# Patient Record
Sex: Female | Born: 1960
Health system: Southern US, Community
[De-identification: ages and names within clinical notes are randomized; demographics above are authoritative.]

## PROBLEM LIST (undated history)

## (undated) DIAGNOSIS — T7840XA Allergy, unspecified, initial encounter: Secondary | ICD-10-CM

## (undated) DIAGNOSIS — G589 Mononeuropathy, unspecified: Secondary | ICD-10-CM

## (undated) DIAGNOSIS — D573 Sickle-cell trait: Secondary | ICD-10-CM

## (undated) DIAGNOSIS — E7800 Pure hypercholesterolemia, unspecified: Secondary | ICD-10-CM

## (undated) DIAGNOSIS — D649 Anemia, unspecified: Secondary | ICD-10-CM

## (undated) HISTORY — PX: COLOSTOMY: SHX63

## (undated) HISTORY — PX: ABDOMINAL HYSTERECTOMY: SHX81

## (undated) HISTORY — PX: COLONOSCOPY: SHX174

## (undated) HISTORY — DX: Anemia, unspecified: D64.9

## (undated) HISTORY — PX: COLOSTOMY CLOSURE: SHX1381

## (undated) HISTORY — DX: Allergy, unspecified, initial encounter: T78.40XA

## (undated) HISTORY — PX: COLON SURGERY: SHX602

---

## 1999-01-18 ENCOUNTER — Encounter: Payer: Self-pay | Admitting: Internal Medicine

## 1999-01-18 ENCOUNTER — Encounter: Admission: RE | Admit: 1999-01-18 | Discharge: 1999-01-18 | Payer: Self-pay | Admitting: Internal Medicine

## 1999-01-20 ENCOUNTER — Inpatient Hospital Stay (HOSPITAL_COMMUNITY): Admission: RE | Admit: 1999-01-20 | Discharge: 1999-01-23 | Payer: Self-pay | Admitting: *Deleted

## 1999-03-10 ENCOUNTER — Inpatient Hospital Stay (HOSPITAL_COMMUNITY): Admission: RE | Admit: 1999-03-10 | Discharge: 1999-03-16 | Payer: Self-pay | Admitting: *Deleted

## 1999-03-10 ENCOUNTER — Emergency Department (HOSPITAL_COMMUNITY): Admission: EM | Admit: 1999-03-10 | Discharge: 1999-03-10 | Payer: Self-pay | Admitting: *Deleted

## 1999-11-09 ENCOUNTER — Ambulatory Visit (HOSPITAL_BASED_OUTPATIENT_CLINIC_OR_DEPARTMENT_OTHER): Admission: RE | Admit: 1999-11-09 | Discharge: 1999-11-09 | Payer: Self-pay | Admitting: *Deleted

## 2000-01-19 ENCOUNTER — Encounter: Payer: Self-pay | Admitting: Internal Medicine

## 2000-01-19 ENCOUNTER — Encounter: Admission: RE | Admit: 2000-01-19 | Discharge: 2000-01-19 | Payer: Self-pay | Admitting: Internal Medicine

## 2002-12-07 ENCOUNTER — Encounter: Payer: Self-pay | Admitting: Emergency Medicine

## 2002-12-07 ENCOUNTER — Emergency Department (HOSPITAL_COMMUNITY): Admission: EM | Admit: 2002-12-07 | Discharge: 2002-12-07 | Payer: Self-pay | Admitting: Emergency Medicine

## 2010-04-02 ENCOUNTER — Other Ambulatory Visit: Payer: Self-pay | Admitting: Emergency Medicine

## 2010-04-02 DIAGNOSIS — Z1231 Encounter for screening mammogram for malignant neoplasm of breast: Secondary | ICD-10-CM

## 2010-04-12 ENCOUNTER — Ambulatory Visit
Admission: RE | Admit: 2010-04-12 | Discharge: 2010-04-12 | Disposition: A | Discharge status: Home / Self Care | Payer: Federal, State, Local not specified - PPO | Source: Ambulatory Visit | Attending: Emergency Medicine | Admitting: Emergency Medicine

## 2010-04-12 DIAGNOSIS — Z1231 Encounter for screening mammogram for malignant neoplasm of breast: Secondary | ICD-10-CM

## 2010-11-04 ENCOUNTER — Other Ambulatory Visit: Payer: Self-pay | Admitting: Family Medicine

## 2010-11-18 ENCOUNTER — Ambulatory Visit
Admission: RE | Admit: 2010-11-18 | Discharge: 2010-11-18 | Disposition: A | Discharge status: Home / Self Care | Payer: Federal, State, Local not specified - PPO | Source: Ambulatory Visit | Attending: Family Medicine | Admitting: Family Medicine

## 2011-02-28 ENCOUNTER — Ambulatory Visit (INDEPENDENT_AMBULATORY_CARE_PROVIDER_SITE_OTHER): Payer: Federal, State, Local not specified - PPO

## 2011-02-28 DIAGNOSIS — E119 Type 2 diabetes mellitus without complications: Secondary | ICD-10-CM

## 2011-02-28 DIAGNOSIS — E785 Hyperlipidemia, unspecified: Secondary | ICD-10-CM

## 2011-03-28 ENCOUNTER — Other Ambulatory Visit: Payer: Self-pay | Admitting: Family Medicine

## 2011-04-28 ENCOUNTER — Emergency Department (HOSPITAL_COMMUNITY)
Admission: EM | Admit: 2011-04-28 | Discharge: 2011-04-28 | Disposition: A | Discharge status: Home Health | Payer: Federal, State, Local not specified - PPO | Attending: Emergency Medicine | Admitting: Emergency Medicine

## 2011-04-28 ENCOUNTER — Encounter (HOSPITAL_COMMUNITY): Payer: Self-pay | Admitting: Emergency Medicine

## 2011-04-28 DIAGNOSIS — R197 Diarrhea, unspecified: Secondary | ICD-10-CM | POA: Insufficient documentation

## 2011-04-28 DIAGNOSIS — E119 Type 2 diabetes mellitus without complications: Secondary | ICD-10-CM | POA: Insufficient documentation

## 2011-04-28 DIAGNOSIS — R6883 Chills (without fever): Secondary | ICD-10-CM | POA: Insufficient documentation

## 2011-04-28 DIAGNOSIS — R5381 Other malaise: Secondary | ICD-10-CM | POA: Insufficient documentation

## 2011-04-28 DIAGNOSIS — R5383 Other fatigue: Secondary | ICD-10-CM | POA: Insufficient documentation

## 2011-04-28 DIAGNOSIS — R112 Nausea with vomiting, unspecified: Secondary | ICD-10-CM

## 2011-04-28 DIAGNOSIS — R109 Unspecified abdominal pain: Secondary | ICD-10-CM | POA: Insufficient documentation

## 2011-04-28 DIAGNOSIS — E7800 Pure hypercholesterolemia, unspecified: Secondary | ICD-10-CM | POA: Insufficient documentation

## 2011-04-28 DIAGNOSIS — R000 Tachycardia, unspecified: Secondary | ICD-10-CM | POA: Insufficient documentation

## 2011-04-28 DIAGNOSIS — Z79899 Other long term (current) drug therapy: Secondary | ICD-10-CM | POA: Insufficient documentation

## 2011-04-28 HISTORY — DX: Pure hypercholesterolemia, unspecified: E78.00

## 2011-04-28 HISTORY — DX: Sickle-cell trait: D57.3

## 2011-04-28 LAB — BASIC METABOLIC PANEL
BUN: 18 mg/dL (ref 6–23)
CO2: 21 mEq/L (ref 19–32)
Calcium: 10.6 mg/dL — ABNORMAL HIGH (ref 8.4–10.5)
Chloride: 104 mEq/L (ref 96–112)
Creatinine, Ser: 0.73 mg/dL (ref 0.50–1.10)
GFR calc Af Amer: >90 mL/min (ref >90)
GFR calc non Af Amer: >90 mL/min (ref >90)
Glucose, Bld: 216 mg/dL — ABNORMAL HIGH (ref 70–99)
Potassium: 4.1 mEq/L (ref 3.5–5.1)
Sodium: 139 mEq/L (ref 135–145)

## 2011-04-28 LAB — DIFFERENTIAL
Basophils Absolute: 0 10*3/uL (ref 0.0–0.1)
Basophils Relative: 0 % (ref 0–1)
Eosinophils Absolute: 0 10*3/uL (ref 0.0–0.7)
Eosinophils Relative: 0 % (ref 0–5)
Lymphocytes Relative: 2 % — ABNORMAL LOW (ref 12–46)
Lymphs Abs: 0.4 10*3/uL — ABNORMAL LOW (ref 0.7–4.0)
Monocytes Absolute: 0.8 10*3/uL (ref 0.1–1.0)
Monocytes Relative: 4 % (ref 3–12)
Neutro Abs: 16.6 10*3/uL — ABNORMAL HIGH (ref 1.7–7.7)
Neutrophils Relative %: 93 % — ABNORMAL HIGH (ref 43–77)

## 2011-04-28 LAB — URINALYSIS, ROUTINE W REFLEX MICROSCOPIC
Bilirubin Urine: NEGATIVE
Glucose, UA: 100 mg/dL — AB
Hgb urine dipstick: NEGATIVE
Ketones, ur: 40 mg/dL — AB
Leukocytes, UA: NEGATIVE
Nitrite: NEGATIVE
Protein, ur: NEGATIVE mg/dL
Specific Gravity, Urine: 1.024 (ref 1.005–1.030)
Urobilinogen, UA: 0.2 mg/dL (ref 0.0–1.0)
pH: 6 (ref 5.0–8.0)

## 2011-04-28 LAB — CBC
HCT: 42.6 % (ref 36.0–46.0)
Hemoglobin: 14.6 g/dL (ref 12.0–15.0)
MCH: 27.2 pg (ref 26.0–34.0)
MCHC: 34.3 g/dL (ref 30.0–36.0)
MCV: 79.3 fL (ref 78.0–100.0)
Platelets: 208 10*3/uL (ref 150–400)
RBC: 5.37 MIL/uL — ABNORMAL HIGH (ref 3.87–5.11)
RDW: 14.3 % (ref 11.5–15.5)
WBC: 17.8 10*3/uL — ABNORMAL HIGH (ref 4.0–10.5)

## 2011-04-28 MED ORDER — MORPHINE SULFATE 4 MG/ML IJ SOLN
4.0000 mg | Freq: Once | INTRAMUSCULAR | Status: AC
  Administered 2011-04-28: 4 mg via INTRAVENOUS
  Filled 2011-04-28: qty 1

## 2011-04-28 MED ORDER — SODIUM CHLORIDE 0.9 % IV BOLUS (SEPSIS)
1000.0000 mL | Freq: Once | INTRAVENOUS | Status: AC
  Administered 2011-04-28: 1000 mL via INTRAVENOUS

## 2011-04-28 MED ORDER — ONDANSETRON HCL 4 MG/2ML IJ SOLN
4.0000 mg | Freq: Once | INTRAMUSCULAR | Status: AC
  Administered 2011-04-28: 4 mg via INTRAVENOUS
  Filled 2011-04-28: qty 2

## 2011-04-28 MED ORDER — ONDANSETRON 8 MG PO TBDP: 8.0000 mg | ORAL_TABLET | Freq: Three times a day (TID) | ORAL | Status: AC | PRN

## 2011-04-28 NOTE — ED Notes (Signed)
No nausea or vomiting while at the hospital. Water, Ginger ale, and crackers given. Will continue to monitor.

## 2011-04-28 NOTE — Discharge Instructions (Signed)
Diet for Diarrhea, Adult Having frequent, runny stools (diarrhea) has many causes. Diarrhea may be caused or worsened by food or drink. Diarrhea may be relieved by changing your diet. IF YOU ARE NOT TOLERATING SOLID FOODS:  Drink enough water and fluids to keep your urine clear or pale yellow.   Avoid sugary drinks and sodas as well as milk-based beverages.   Avoid beverages containing caffeine and alcohol.   You may try rehydrating beverages. You can make your own by following this recipe:    tsp table salt.    tsp baking soda.   ? tsp salt substitute (potassium chloride).   1 tbs + 1 tsp sugar.   1 qt water.  As your stools become more solid, you can start eating solid foods. Add foods one at a time. If a certain food causes your diarrhea to get worse, avoid that food and try other foods. A low fiber, low-fat, and lactose-free diet is recommended. Small, frequent meals may be better tolerated.  Starches  Allowed:  White, French, and pita breads, plain rolls, buns, bagels. Plain muffins, matzo. Soda, saltine, or graham crackers. Pretzels, melba toast, zwieback. Cooked cereals made with water: cornmeal, farina, cream cereals. Dry cereals: refined corn, wheat, rice. Potatoes prepared any way without skins, refined macaroni, spaghetti, noodles, refined rice.   Avoid:  Bread, rolls, or crackers made with whole wheat, multi-grains, rye, bran seeds, nuts, or coconut. Corn tortillas or taco shells. Cereals containing whole grains, multi-grains, bran, coconut, nuts, or raisins. Cooked or dry oatmeal. Coarse wheat cereals, granola. Cereals advertised as "high-fiber." Potato skins. Whole grain pasta, wild or brown rice. Popcorn. Sweet potatoes/yams. Sweet rolls, doughnuts, waffles, pancakes, sweet breads.  Vegetables  Allowed: Strained tomato and vegetable juices. Most well-cooked and canned vegetables without seeds. Fresh: Tender lettuce, cucumber without the skin, cabbage, spinach, bean  sprouts.   Avoid: Fresh, cooked, or canned: Artichokes, baked beans, beet greens, broccoli, Brussels sprouts, corn, kale, legumes, peas, sweet potatoes. Cooked: Green or red cabbage, spinach. Avoid large servings of any vegetables, because vegetables shrink when cooked, and they contain more fiber per serving than fresh vegetables.  Fruit  Allowed: All fruit juices except prune juice. Cooked or canned: Apricots, applesauce, cantaloupe, cherries, fruit cocktail, grapefruit, grapes, kiwi, mandarin oranges, peaches, pears, plums, watermelon. Fresh: Apples without skin, ripe banana, grapes, cantaloupe, cherries, grapefruit, peaches, oranges, plums. Keep servings limited to  cup or 1 piece.   Avoid: Fresh: Apple with skin, apricots, mango, pears, raspberries, strawberries. Prune juice, stewed or dried prunes. Dried fruits, raisins, dates. Large servings of all fresh fruits.  Meat and Meat Substitutes  Allowed: Ground or well-cooked tender beef, ham, veal, lamb, pork, or poultry. Eggs, plain cheese. Fish, oysters, shrimp, lobster, other seafoods. Liver, organ meats.   Avoid: Tough, fibrous meats with gristle. Peanut butter, smooth or chunky. Cheese, nuts, seeds, legumes, dried peas, beans, lentils.  Milk  Allowed: Yogurt, lactose-free milk, kefir, drinkable yogurt, buttermilk, soy milk.   Avoid: Milk, chocolate milk, beverages made with milk, such as milk shakes.  Soups  Allowed: Bouillon, broth, or soups made from allowed foods. Any strained soup.   Avoid: Soups made from vegetables that are not allowed, cream or milk-based soups.  Desserts and Sweets  Allowed: Sugar-free gelatin, sugar-free frozen ice pops made without sugar alcohol.   Avoid: Plain cakes and cookies, pie made with allowed fruit, pudding, custard, cream pie. Gelatin, fruit, ice, sherbet, frozen ice pops. Ice cream, ice milk without nuts. Plain hard candy,   honey, jelly, molasses, syrup, sugar, chocolate syrup, gumdrops,  marshmallows.  Fats and Oils  Allowed: Avoid any fats and oils.   Avoid: Seeds, nuts, olives, avocados. Margarine, butter, cream, mayonnaise, salad oils, plain salad dressings made from allowed foods. Plain gravy, crisp bacon without rind.  Beverages  Allowed: Water, decaffeinated teas, oral rehydration solutions, sugar-free beverages.   Avoid: Fruit juices, caffeinated beverages (coffee, tea, soda or pop), alcohol, sports drinks, or lemon-lime soda or pop.  Condiments  Allowed: Ketchup, mustard, horseradish, vinegar, cream sauce, cheese sauce, cocoa powder. Spices in moderation: allspice, basil, bay leaves, celery powder or leaves, cinnamon, cumin powder, curry powder, ginger, mace, marjoram, onion or garlic powder, oregano, paprika, parsley flakes, ground pepper, rosemary, sage, savory, tarragon, thyme, turmeric.   Avoid: Coconut, honey.  Weight Monitoring: Weigh yourself every day. You should weigh yourself in the morning after you urinate and before you eat breakfast. Wear the same amount of clothing when you weigh yourself. Record your weight daily. Bring your recorded weights to your clinic visits. Tell your caregiver right away if you have gained 3 lb/1.4 kg or more in 1 day, 5 lb/2.3 kg in a week, or whatever amount you were told to report. SEEK IMMEDIATE MEDICAL CARE IF:   You are unable to keep fluids down.   You start to throw up (vomit) or diarrhea keeps coming back (persistent).   Abdominal pain develops, increases, or can be felt in one place (localizes).   You have an oral temperature above 102 F (38.9 C), not controlled by medicine.   Diarrhea contains blood or mucus.   You develop excessive weakness, dizziness, fainting, or extreme thirst.  MAKE SURE YOU:   Understand these instructions.   Will watch your condition.   Will get help right away if you are not doing well or get worse.  Document Released: 04/30/2003 Document Revised: 01/27/2011 Document Reviewed:  08/21/2008 Exit[employer] Patient Information 2012 [employer].Nausea and Vomiting Nausea is a sick feeling that often comes before throwing up (vomiting). Vomiting is a reflex where stomach contents come out of your mouth. Vomiting can cause severe loss of body fluids (dehydration). Children and elderly adults can become dehydrated quickly, especially if they also have diarrhea. Nausea and vomiting are symptoms of a condition or disease. It is important to find the cause of your symptoms. CAUSES   Direct irritation of the stomach lining. This irritation can result from increased acid production (gastroesophageal reflux disease), infection, food poisoning, taking certain medicines (such as nonsteroidal anti-inflammatory drugs), alcohol use, or tobacco use.   Signals from the brain.These signals could be caused by a headache, heat exposure, an inner ear disturbance, increased pressure in the brain from injury, infection, a tumor, or a concussion, pain, emotional stimulus, or metabolic problems.   An obstruction in the gastrointestinal tract (bowel obstruction).   Illnesses such as diabetes, hepatitis, gallbladder problems, appendicitis, kidney problems, cancer, sepsis, atypical symptoms of a heart attack, or eating disorders.   Medical treatments such as chemotherapy and radiation.   Receiving medicine that makes you sleep (general anesthetic) during surgery.  DIAGNOSIS Your caregiver may ask for tests to be done if the problems do not improve after a few days. Tests may also be done if symptoms are severe or if the reason for the nausea and vomiting is not clear. Tests may include:  Urine tests.   Blood tests.   Stool tests.   Cultures (to look for evidence of infection).   X-rays or other imaging   studies.  Test results can help your caregiver make decisions about treatment or the need for additional tests. TREATMENT You need to stay well hydrated. Drink frequently but in small  amounts.You may wish to drink water, sports drinks, clear broth, or eat frozen ice pops or gelatin dessert to help stay hydrated.When you eat, eating slowly may help prevent nausea.There are also some antinausea medicines that may help prevent nausea. HOME CARE INSTRUCTIONS   Take all medicine as directed by your caregiver.   If you do not have an appetite, do not force yourself to eat. However, you must continue to drink fluids.   If you have an appetite, eat a normal diet unless your caregiver tells you differently.   Eat a variety of complex carbohydrates (rice, wheat, potatoes, bread), lean meats, yogurt, fruits, and vegetables.   Avoid high-fat foods because they are more difficult to digest.   Drink enough water and fluids to keep your urine clear or pale yellow.   If you are dehydrated, ask your caregiver for specific rehydration instructions. Signs of dehydration may include:   Severe thirst.   Dry lips and mouth.   Dizziness.   Dark urine.   Decreasing urine frequency and amount.   Confusion.   Rapid breathing or pulse.  SEEK IMMEDIATE MEDICAL CARE IF:   You have blood or brown flecks (like coffee grounds) in your vomit.   You have black or bloody stools.   You have a severe headache or stiff neck.   You are confused.   You have severe abdominal pain.   You have chest pain or trouble breathing.   You do not urinate at least once every 8 hours.   You develop cold or clammy skin.   You continue to vomit for longer than 24 to 48 hours.   You have a fever.  MAKE SURE YOU:   Understand these instructions.   Will watch your condition.   Will get help right away if you are not doing well or get worse.  Document Released: 02/07/2005 Document Revised: 01/27/2011 Document Reviewed: 07/07/2010 [employer] Patient Information 2012 [employer]. 

## 2011-04-28 NOTE — ED Notes (Signed)
PT. REPORTS PERSISTENT NAUSEA,VOMITTING , DIARRHEA WITH CHILLS ONSET THIS EVENING ,  STATES MOTHER IS ALSO SICK AT HOME .

## 2011-04-28 NOTE — ED Notes (Signed)
Pt stated that she started having intermittent abdominal cramping since 04/26/11. However, 04/27/11 she begin to have n/v and diarrhea. No blood in either. No cardiac or respiratory distress. Will continue to monitor.

## 2011-04-28 NOTE — ED Notes (Signed)
Ice water given to PT. Will continue to monitor.

## 2011-04-28 NOTE — ED Provider Notes (Signed)
History     CSN: [id number]  Arrival date & time 04/28/11  0059   First MD Initiated Contact with Patient 04/28/11 0127      Chief Complaint  Patient presents with  . Emesis    (Consider location/radiation/quality/duration/timing/severity/associated sxs/prior treatment) Patient is a 51 y.o. female presenting with vomiting. The history is provided by the patient.  Emesis  This is a new problem. Associated symptoms include abdominal pain and diarrhea. Pertinent negatives include no headaches.   patient has had nausea vomiting and diarrhea that began last night. She's also had some upper abdominal cramping. She's had some chills without clear fever. Her mother has had similar symptoms and was seen in ER yesterday for the same. No lightheadedness or dizziness. No dysuria. She states everything she eats,. She states she's also had a lot of dry heaving.  Past Medical History  Diagnosis Date  . Diabetes mellitus   . Hypercholesteremia   . Sickle cell trait   . Endometriosis     Past Surgical History  Procedure Date  . Abdominal hysterectomy   . Colostomy   . Colostomy closure     No family history on file.  History  Substance Use Topics  . Smoking status: Never Smoker   . Smokeless tobacco: Not on file  . Alcohol Use: No    OB History    Grav Para Term Preterm Abortions TAB SAB Ect Mult Living                  Review of Systems  Constitutional: Positive for fatigue. Negative for activity change and appetite change.  HENT: Negative for neck stiffness.   Eyes: Negative for pain.  Respiratory: Negative for chest tightness and shortness of breath.   Cardiovascular: Negative for chest pain and leg swelling.  Gastrointestinal: Positive for nausea, abdominal pain and diarrhea.  Genitourinary: Negative for flank pain.  Musculoskeletal: Negative for back pain.  Skin: Negative for rash.  Neurological: Negative for weakness, numbness and headaches.  Psychiatric/Behavioral:  Negative for behavioral problems.    Allergies  Cephalexin; Amoxicillin; Levsin; and Sulfa antibiotics  Home Medications   Current Outpatient Rx  Name Route Sig Dispense Refill  . CALCIUM GLUCONATE 500 MG PO TABS Oral Take 500 mg by mouth daily.    . CHOLECALCIFEROL 400 UNITS PO TABS Oral Take 400 Units by mouth daily.    Marland Kitchen GARLIC 10 MG PO CAPS Oral Take 1 tablet by mouth daily.    Marland Kitchen LORATADINE-PSEUDOEPHEDRINE ER 5-120 MG PO TB12 Oral Take 1 tablet by mouth 2 (two) times daily as needed. For congestion and headache    . METFORMIN HCL 500 MG PO TABS Oral Take 500 mg by mouth daily with breakfast.    . PRAVASTATIN SODIUM 40 MG PO TABS Oral Take 40 mg by mouth every evening.    Marland Kitchen ONDANSETRON 8 MG PO TBDP Oral Take 1 tablet (8 mg total) by mouth every 8 (eight) hours as needed for nausea. 20 tablet 0    BP 119/70  Pulse 87  Temp(Src) 98.1 F (36.7 C) (Oral)  Resp 18  SpO2 99%  Physical Exam  Nursing note and vitals reviewed. Constitutional: She is oriented to person, place, and time. She appears well-developed and well-nourished.  HENT:  Head: Normocephalic and atraumatic.  Eyes: EOM are normal. Pupils are equal, round, and reactive to light.  Cardiovascular: Regular rhythm and normal heart sounds.   No murmur heard.      Mild tachycardia  Pulmonary/Chest:  Effort normal and breath sounds normal. No respiratory distress. She has no wheezes. She has no rales.  Abdominal: Soft. Bowel sounds are normal. She exhibits no distension. There is no tenderness. There is no rebound and no guarding.  Musculoskeletal: Normal range of motion.  Neurological: She is alert and oriented to person, place, and time. No cranial nerve deficit.  Skin: Skin is warm and dry.  Psychiatric: She has a normal mood and affect. Her speech is normal.    ED Course  Procedures (including critical care time)  Labs Reviewed  URINALYSIS, ROUTINE W REFLEX MICROSCOPIC - Abnormal; Notable for the following:     Glucose, UA 100 (*)    Ketones, ur 40 (*)    All other components within normal limits  CBC - Abnormal; Notable for the following:    WBC 17.8 (*)    RBC 5.37 (*)    All other components within normal limits  DIFFERENTIAL - Abnormal; Notable for the following:    Neutrophils Relative 93 (*)    Neutro Abs 16.6 (*)    Lymphocytes Relative 2 (*)    Lymphs Abs 0.4 (*)    All other components within normal limits  BASIC METABOLIC PANEL - Abnormal; Notable for the following:    Glucose, Bld 216 (*)    Calcium 10.6 (*)    All other components within normal limits   No results found.   1. Nausea vomiting and diarrhea       MDM  NVD with sick contacts. Patient feels better after treatment and vitals have improved. Labs are reassuring overall. WBC elevation likely due to viral infection. Patient has tolerated orals and will be discharged.        Juliet Rude. Rubin Payor, MD 04/28/11 [phone]

## 2011-07-20 ENCOUNTER — Other Ambulatory Visit: Payer: Self-pay | Admitting: Family Medicine

## 2011-09-07 ENCOUNTER — Ambulatory Visit: Payer: Federal, State, Local not specified - PPO

## 2011-09-07 ENCOUNTER — Ambulatory Visit (INDEPENDENT_AMBULATORY_CARE_PROVIDER_SITE_OTHER): Payer: Federal, State, Local not specified - PPO | Admitting: Family Medicine

## 2011-09-07 VITALS — BP 120/88 | HR 86 | Temp 98.7°F | Resp 16 | Ht 61.5 in | Wt 154.4 lb

## 2011-09-07 DIAGNOSIS — M25569 Pain in unspecified knee: Secondary | ICD-10-CM

## 2011-09-07 MED ORDER — OXAPROZIN 600 MG PO TABS: ORAL_TABLET | ORAL | Status: DC

## 2011-09-07 MED ORDER — HYDROCODONE-ACETAMINOPHEN 5-500 MG PO TABS: ORAL_TABLET | ORAL | Status: DC

## 2011-09-07 NOTE — Patient Instructions (Signed)
Wear knee immobilizer  Take Daypro twice daily  Use hydrocodone only for severe pain  Ice 4-5 times daily  Return Sunday

## 2011-09-07 NOTE — Progress Notes (Signed)
Subjective: 51 year old lady with history of wearing a pair of clogs yesterday and stepping wrong, twisting her left ankle. When she caught herself with her right leg she strained her right knee and head pain in it immediately. She is able to walk on it, but if she turns it just a wrong angle or something it will give her intense pain. She has trouble relaxing at because of these pains. Is not swollen or bruised it he. She alternated use of ice and heat on it. She took some over-the-counter Motrin yesterday and today. She has not had prior knee problems. Her left ankle did not bother her as a consequence of the accident.  Objective: No point tenderness of the right knee. She preferentially holds it straight. There is no tenderness of the thigh or calf. Minimal tenderness in the popliteal fossa. No effusion could be palpated. No obvious weakness of the ligaments. When she lay down on her stomach and try that and I attempted to flex her knee, she had intense pain from it.  Assessment: Right knee pain, suspicious for possible internal derangement.  Plan: X-ray right knee  [facility] reading (PRIMARY) by  Dr. Alwyn Ren Possible effusion.  No bony or joint injury .  This seems to be an unusual injury of some sort. We will rested a few days with a knee immobilizer. She is to stay off work, taking NSAIDs, and ice it. She is to return on Sunday for recheck. If she is not doing a lot better I think she should be referred to orthopedist. I tend to let the orthopedic doctor decide on further studies

## 2011-09-11 ENCOUNTER — Ambulatory Visit (INDEPENDENT_AMBULATORY_CARE_PROVIDER_SITE_OTHER): Payer: Federal, State, Local not specified - PPO | Admitting: Family Medicine

## 2011-09-11 VITALS — BP 118/82 | HR 75 | Temp 97.8°F | Resp 16 | Ht 62.5 in | Wt 154.2 lb

## 2011-09-11 DIAGNOSIS — S8392XA Sprain of unspecified site of left knee, initial encounter: Secondary | ICD-10-CM

## 2011-09-11 DIAGNOSIS — E785 Hyperlipidemia, unspecified: Secondary | ICD-10-CM

## 2011-09-11 DIAGNOSIS — E109 Type 1 diabetes mellitus without complications: Secondary | ICD-10-CM

## 2011-09-11 DIAGNOSIS — E119 Type 2 diabetes mellitus without complications: Secondary | ICD-10-CM

## 2011-09-11 LAB — COMPREHENSIVE METABOLIC PANEL
ALT: 14 U/L (ref 0–35)
AST: 17 U/L (ref 0–37)
Albumin: 4.2 g/dL (ref 3.5–5.2)
Alkaline Phosphatase: 64 U/L (ref 39–117)
BUN: 16 mg/dL (ref 6–23)
CO2: 23 mEq/L (ref 19–32)
Calcium: 9.3 mg/dL (ref 8.4–10.5)
Chloride: 105 mEq/L (ref 96–112)
Creat: 0.87 mg/dL (ref 0.50–1.10)
Glucose, Bld: 122 mg/dL — ABNORMAL HIGH (ref 70–99)
Potassium: 4.2 mEq/L (ref 3.5–5.3)
Sodium: 139 mEq/L (ref 135–145)
Total Bilirubin: 0.3 mg/dL (ref 0.3–1.2)
Total Protein: 6.9 g/dL (ref 6.0–8.3)

## 2011-09-11 LAB — LIPID PANEL
Cholesterol: 179 mg/dL (ref 0–200)
HDL: 45 mg/dL (ref >39)
LDL Cholesterol: 113 mg/dL — ABNORMAL HIGH (ref 0–99)
Total CHOL/HDL Ratio: 4 Ratio
Triglycerides: 103 mg/dL (ref <150)
VLDL: 21 mg/dL (ref 0–40)

## 2011-09-11 LAB — GLUCOSE, POCT (MANUAL RESULT ENTRY): POC Glucose: 122 mg/dl — AB (ref 70–99)

## 2011-09-11 LAB — MICROALBUMIN, URINE: Microalb, Ur: 1.47 mg/dL (ref 0.00–1.89)

## 2011-09-11 LAB — POCT GLYCOSYLATED HEMOGLOBIN (HGB A1C): Hemoglobin A1C: 6.7

## 2011-09-11 MED ORDER — METFORMIN HCL 500 MG PO TABS: 500.0000 mg | ORAL_TABLET | Freq: Every day | ORAL | Status: DC

## 2011-09-11 NOTE — Progress Notes (Signed)
Subjective:  51 year old lady with history of wearing a pair of clogs Tuesday and stepping wrong, twisting her left ankle. When she caught herself with her right leg she strained her right knee and had pain in it immediately.  She is able to walk on it, but if she turns it just a wrong angle or something it will give her intense pain. She has trouble relaxing at because of these pains. Is not swollen or bruised. She alternated use of ice and heat on it. She took some over-the-counter Motrin initially. She has not had prior knee problems. Her left ankle did not bother her as a consequence of the accident.  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  The knee is much better.  Now, pain is 3/10  O:  Right knee:  No effusion, nontender, ligaments intact ROM: good  A:  Good improvement  P:  Oxaprozin 600 bid to continue. Work note.  Also, needs recheck on hemoglobin A1C.  She was reduced to metformin qod but she thinks she probably needs to take it every day. Weight went up Still walking  O:  NAD Chest: clear Fundi: negative Heart: reg Ext:  Filament negative both feet with no sores either Results for orders placed in visit on 09/11/11  POCT GLYCOSYLATED HEMOGLOBIN (HGB A1C)      Component Value Range   Hemoglobin A1C 6.7    GLUCOSE, POCT (MANUAL RESULT ENTRY)      Component Value Range   POC Glucose 122 (*) 70 - 99 mg/dl  ' Assessment:  Good diabetic control.  Weight gain is a problem, however, and I think patient will do better longterm with daily metformin  Plan:

## 2011-09-29 ENCOUNTER — Other Ambulatory Visit: Payer: Self-pay | Admitting: Family Medicine

## 2012-01-21 ENCOUNTER — Other Ambulatory Visit: Payer: Self-pay | Admitting: Physician Assistant

## 2012-02-23 ENCOUNTER — Ambulatory Visit (INDEPENDENT_AMBULATORY_CARE_PROVIDER_SITE_OTHER): Payer: Federal, State, Local not specified - PPO | Admitting: Emergency Medicine

## 2012-02-23 VITALS — BP 144/90 | HR 104 | Temp 98.6°F | Resp 18 | Ht 61.5 in | Wt 151.0 lb

## 2012-02-23 DIAGNOSIS — J209 Acute bronchitis, unspecified: Secondary | ICD-10-CM

## 2012-02-23 DIAGNOSIS — J018 Other acute sinusitis: Secondary | ICD-10-CM

## 2012-02-23 MED ORDER — HYDROCOD POLST-CHLORPHEN POLST 10-8 MG/5ML PO LQCR: 5.0000 mL | Freq: Two times a day (BID) | ORAL | Status: DC | PRN

## 2012-02-23 MED ORDER — LEVOFLOXACIN 500 MG PO TABS: 500.0000 mg | ORAL_TABLET | Freq: Every day | ORAL | Status: AC

## 2012-02-23 MED ORDER — PSEUDOEPHEDRINE-GUAIFENESIN ER 60-600 MG PO TB12: 1.0000 | ORAL_TABLET | Freq: Two times a day (BID) | ORAL | Status: AC

## 2012-02-23 NOTE — Progress Notes (Signed)
[facility] [address] [place] [address] [phone]  Date:  02/23/2012   Name:  Kelly Fitzpatrick   DOB:  [birth year]   MRN:  [id number]  PCP:  No primary provider on file.    Chief Complaint: Cough and Nasal Congestion   History of Present Illness:  Kelly Fitzpatrick is a 52 y.o. very pleasant female patient who presents with the following:  Ill with nasal congestion and post nasal drainage and a cough for the past three days.  Cough is not productive and worse when recumbent.  No fever or chills, sore throat, wheezing, or shortness of breath.  No nausea or vomitting.  No improvement with OTC medication.  No sick contacts at home.  No flu shot  There is no problem list on file for this patient.   Past Medical History  Diagnosis Date  . Diabetes mellitus   . Hypercholesteremia   . Endometriosis   . Allergy   . Anemia   . Hypertension   . Sickle cell trait     Past Surgical History  Procedure Date  . Abdominal hysterectomy   . Colostomy   . Colostomy closure     History  Substance Use Topics  . Smoking status: Never Smoker   . Smokeless tobacco: Not on file  . Alcohol Use: No    Family History  Problem Relation Age of Onset  . Hypertension Mother   . Cancer Mother     breast cancer  . Cancer Father     lung cancer  . Multiple sclerosis Brother   . Heart disease Brother     heart transplant    Allergies  Allergen Reactions  . Cephalexin Other (See Comments)    unknown  . Amoxicillin Rash  . Hyoscyamine Sulfate Rash  . Sulfa Antibiotics Rash    Medication list has been reviewed and updated.  Current Outpatient Prescriptions on File Prior to Visit  Medication Sig Dispense Refill  . calcium gluconate 500 MG tablet Take 500 mg by mouth daily.      . cholecalciferol (VITAMIN D) 400 UNITS TABS Take 400 Units by mouth daily.      . Garlic 10 MG CAPS Take 1 tablet by mouth daily.      Marland Kitchen loratadine-pseudoephedrine (CLARITIN-D  12-HOUR) 5-120 MG per tablet Take 1 tablet by mouth 2 (two) times daily as needed. For congestion and headache      . metFORMIN (GLUCOPHAGE) 500 MG tablet Take 1 tablet (500 mg total) by mouth daily.  90 tablet  3  . ONE TOUCH ULTRA TEST test strip TEST EVERY DAY  100 each  0  . pravastatin (PRAVACHOL) 40 MG tablet TAKE 1 TABLET BY MOUTH AT BEDTIME  30 tablet  0    Review of Systems:  As per HPI, otherwise negative.    Physical Examination: Filed Vitals:   02/23/12 1714  BP: 144/90  Pulse: 104  Temp: 98.6 F (37 C)  Resp: 18   Filed Vitals:   02/23/12 1714  Height: 5' 1.5" (1.562 m)  Weight: 151 lb (68.493 kg)   Body mass index is 28.07 kg/(m^2). Ideal Body Weight: Weight in (lb) to have BMI = 25: 134.2   GEN: WDWN, NAD, Non-toxic, A & O x 3  No rash or sepsis HEENT: Atraumatic, Normocephalic. Neck supple. No masses, No LAD.  Oropharynx negative Ears and Nose: No external deformity.  TM negative  Purulent nasal drainage CV: RRR, No  M/G/R. No JVD. No thrill. No extra heart sounds. PULM: CTA B, no wheezes, crackles, rhonchi. No retractions. No resp. distress. No accessory muscle use. ABD: S, NT, ND, +BS. No rebound. No HSM. EXTR: No c/c/e NEURO Normal gait.  PSYCH: Normally interactive. Conversant. Not depressed or anxious appearing.  Calm demeanor.    Assessment and Plan: Sinusitis Bronchitis levaquin mucinex d tussionex   Carmelina Dane, MD

## 2012-03-16 ENCOUNTER — Other Ambulatory Visit: Payer: Self-pay | Admitting: Physician Assistant

## 2012-03-16 NOTE — Telephone Encounter (Signed)
Needs office visit and labs.

## 2012-05-05 ENCOUNTER — Other Ambulatory Visit: Payer: Self-pay | Admitting: Family Medicine

## 2012-06-20 ENCOUNTER — Ambulatory Visit (INDEPENDENT_AMBULATORY_CARE_PROVIDER_SITE_OTHER): Payer: Federal, State, Local not specified - PPO | Admitting: Family Medicine

## 2012-06-20 ENCOUNTER — Encounter: Payer: Self-pay | Admitting: Family Medicine

## 2012-06-20 VITALS — BP 119/82 | HR 71 | Temp 98.0°F | Resp 16 | Ht 62.5 in | Wt 153.0 lb

## 2012-06-20 DIAGNOSIS — E785 Hyperlipidemia, unspecified: Secondary | ICD-10-CM

## 2012-06-20 DIAGNOSIS — E1149 Type 2 diabetes mellitus with other diabetic neurological complication: Secondary | ICD-10-CM

## 2012-06-20 DIAGNOSIS — E119 Type 2 diabetes mellitus without complications: Secondary | ICD-10-CM

## 2012-06-20 LAB — POCT GLYCOSYLATED HEMOGLOBIN (HGB A1C): Hemoglobin A1C: 6.7

## 2012-06-20 MED ORDER — METFORMIN HCL 500 MG PO TABS: 500.0000 mg | ORAL_TABLET | Freq: Every day | ORAL | Status: DC

## 2012-06-20 MED ORDER — PRAVASTATIN SODIUM 40 MG PO TABS: 40.0000 mg | ORAL_TABLET | Freq: Every day | ORAL | Status: DC

## 2012-06-20 NOTE — Progress Notes (Signed)
Patient ID: Kelly Fitzpatrick MRN: [id number], DOB: 02/27/1960, 52 y.o. Date of Encounter: 06/20/2012, 7:00 PM  Primary Physician: No PCP Per Patient  Chief Complaint: Diabetes follow up  HPI: 52 y.o. year old female with history below presents for follow up of diabetes mellitus. Doing well. No issues or complaints. Taking medications daily without adverse effects. No polydipsia, polyphagia, polyuria, or nocturia.  Blood sugars at home: Diet consists of: Exercising regularly. Last A1C:   Eye MD: DDS: Influenza vaccine: Pneumococcal vaccine: Last CPE:  Past Medical History  Diagnosis Date  . Diabetes mellitus   . Hypercholesteremia   . Endometriosis   . Allergy   . Anemia   . Hypertension   . Sickle cell trait      Home Meds: Prior to Admission medications   Medication Sig Start Date End Date Taking? Authorizing Provider  calcium gluconate 500 MG tablet Take 500 mg by mouth daily.   Yes Historical Provider, MD  cholecalciferol (VITAMIN D) 400 UNITS TABS Take 400 Units by mouth daily.   Yes Historical Provider, MD  Garlic 10 MG CAPS Take 1 tablet by mouth daily.   Yes Historical Provider, MD  metFORMIN (GLUCOPHAGE) 500 MG tablet Take 1 tablet (500 mg total) by mouth daily. 06/20/12  Yes Elvina Sidle, MD  ONE [facility] ULTRA TEST test strip TEST EVERY DAY 07/20/11  Yes Ryan M Dunn, PA-C  pravastatin (PRAVACHOL) 40 MG tablet Take 1 tablet (40 mg total) by mouth daily. 06/20/12  Yes Elvina Sidle, MD  loratadine-pseudoephedrine (CLARITIN-D 12-HOUR) 5-120 MG per tablet Take 1 tablet by mouth 2 (two) times daily as needed. For congestion and headache    Historical Provider, MD  pseudoephedrine-guaifenesin (MUCINEX D) 60-600 MG per tablet Take 1 tablet by mouth every 12 (twelve) hours. 02/23/12 02/22/13  Phillips Odor, MD    Allergies:  Allergies  Allergen Reactions  . Cephalexin Other (See Comments)    unknown  . Amoxicillin Rash  . Hyoscyamine Sulfate Rash  . Sulfa  Antibiotics Rash    History   Social History  . Marital Status: Single    Spouse Name: N/A    Number of Children: N/A  . Years of Education: N/A   Occupational History  . Not on file.   Social History Main Topics  . Smoking status: Never Smoker   . Smokeless tobacco: Not on file  . Alcohol Use: No  . Drug Use: No  . Sexually Active:    Other Topics Concern  . Not on file   Social History Narrative  . No narrative on file     Review of Systems: Constitutional: negative for chills, fever, night sweats, weight changes, or fatigue  HEENT: negative for vision changes, hearing loss, congestion, rhinorrhea, or epistaxis Cardiovascular: negative for chest pain, palpitations, diaphoresis, DOE, orthopnea, or edema Respiratory: negative for hemoptysis, wheezing, shortness of breath, dyspnea, or cough Abdominal: negative for abdominal pain, nausea, vomiting, diarrhea, or constipation Dermatological: negative for rash, erythema, or wounds Neurologic: negative for headache, dizziness, or syncope Renal:  Negative for polyuria, polydipsia, or dysuria All other systems reviewed and are otherwise negative with the exception to those above and in the HPI.   Physical Exam: Blood pressure 119/82, pulse 71, temperature 98 F (36.7 C), temperature source Oral, resp. rate 16, height 5' 2.5" (1.588 m), weight 153 lb (69.4 kg), SpO2 100.00%., Body mass index is 27.52 kg/(m^2). General: Well developed, well nourished, in no acute distress. Head: Normocephalic, atraumatic, eyes without discharge, sclera  non-icteric, nares are without discharge. Bilateral auditory canals clear, TM's are without perforation, pearly grey and translucent with reflective cone of light bilaterally. Oral cavity moist, posterior pharynx without exudate, erythema, peritonsillar abscess, or post nasal drip.  Neck: Supple. No thyromegaly. Full ROM. No lymphadenopathy. Lungs: Clear bilaterally to auscultation without wheezes,  rales, or rhonchi. Breathing is unlabored. Heart: RRR with S1 S2. No murmurs, rubs, or gallops appreciated. Abdomen: Soft, non-tender, non-distended with normoactive bowel sounds. No hepatosplenomegaly. No rebound/guarding. No obvious abdominal masses. Msk:  Strength and tone normal for age. Extremities/Skin: Warm and dry. No clubbing or cyanosis. No edema. No rashes, wounds, or suspicious lesions. Monofilament exam  normal.  Neuro: Alert and oriented X 3. Moves all extremities spontaneously. Gait is normal. CNII-XII grossly in tact. Psych:  Responds to questions appropriately with a normal affect.   Labs:   ASSESSMENT AND PLAN:  52 y.o. year old female with 2 years or so of diabetes and hyperlipidemia Hyperlipidemia - Plan: pravastatin (PRAVACHOL) 40 MG tablet, Lipid panel  Diabetes mellitus - Plan: metFORMIN (GLUCOPHAGE) 500 MG tablet, POCT glycosylated hemoglobin (Hb A1C), Comprehensive metabolic panel, Lipid panel, Microalbumin, urine   -  Signed, Elvina Sidle, MD 06/20/2012 7:00 PM

## 2012-06-21 LAB — COMPREHENSIVE METABOLIC PANEL
ALT: 12 U/L (ref 0–35)
AST: 14 U/L (ref 0–37)
Albumin: 4.6 g/dL (ref 3.5–5.2)
Alkaline Phosphatase: 66 U/L (ref 39–117)
BUN: 15 mg/dL (ref 6–23)
CO2: 26 mEq/L (ref 19–32)
Calcium: 9.8 mg/dL (ref 8.4–10.5)
Chloride: 104 mEq/L (ref 96–112)
Creat: 0.84 mg/dL (ref 0.50–1.10)
Glucose, Bld: 122 mg/dL — ABNORMAL HIGH (ref 70–99)
Potassium: 4.2 mEq/L (ref 3.5–5.3)
Sodium: 139 mEq/L (ref 135–145)
Total Bilirubin: 0.5 mg/dL (ref 0.3–1.2)
Total Protein: 7.3 g/dL (ref 6.0–8.3)

## 2012-06-21 LAB — LIPID PANEL
Cholesterol: 182 mg/dL (ref 0–200)
HDL: 55 mg/dL (ref >39)
LDL Cholesterol: 114 mg/dL — ABNORMAL HIGH (ref 0–99)
Total CHOL/HDL Ratio: 3.3 Ratio
Triglycerides: 64 mg/dL (ref <150)
VLDL: 13 mg/dL (ref 0–40)

## 2012-06-22 LAB — MICROALBUMIN, URINE: Microalb, Ur: 0.92 mg/dL (ref 0.00–1.89)

## 2012-09-10 ENCOUNTER — Other Ambulatory Visit: Payer: Self-pay | Admitting: Physician Assistant

## 2012-09-10 ENCOUNTER — Other Ambulatory Visit: Payer: Self-pay | Admitting: Family Medicine

## 2012-09-12 NOTE — Telephone Encounter (Signed)
Needs office visit.

## 2012-09-13 ENCOUNTER — Other Ambulatory Visit: Payer: Self-pay | Admitting: Family Medicine

## 2012-09-17 ENCOUNTER — Other Ambulatory Visit: Payer: Self-pay | Admitting: Family Medicine

## 2013-05-11 ENCOUNTER — Ambulatory Visit (INDEPENDENT_AMBULATORY_CARE_PROVIDER_SITE_OTHER): Payer: Federal, State, Local not specified - PPO | Admitting: Family Medicine

## 2013-05-11 VITALS — BP 120/80 | HR 77 | Temp 97.9°F | Resp 16 | Ht 62.5 in | Wt 154.0 lb

## 2013-05-11 DIAGNOSIS — E785 Hyperlipidemia, unspecified: Secondary | ICD-10-CM

## 2013-05-11 DIAGNOSIS — M5430 Sciatica, unspecified side: Secondary | ICD-10-CM

## 2013-05-11 DIAGNOSIS — M25569 Pain in unspecified knee: Secondary | ICD-10-CM

## 2013-05-11 DIAGNOSIS — E119 Type 2 diabetes mellitus without complications: Secondary | ICD-10-CM

## 2013-05-11 LAB — POCT GLYCOSYLATED HEMOGLOBIN (HGB A1C): Hemoglobin A1C: 7.3

## 2013-05-11 MED ORDER — METFORMIN HCL 500 MG PO TABS: 500.0000 mg | ORAL_TABLET | Freq: Every day | ORAL | Status: DC

## 2013-05-11 MED ORDER — PRAVASTATIN SODIUM 40 MG PO TABS: 40.0000 mg | ORAL_TABLET | Freq: Every day | ORAL | Status: DC

## 2013-05-11 NOTE — Patient Instructions (Signed)
Diabetes, Eating Away From Home °Sometimes, you might eat in a restaurant or have meals that are prepared by someone else. You can enjoy eating out. However, the portions in restaurants may be much larger than needed. Listed below are some ideas to help you choose foods that will keep your blood glucose (sugar) in better control.  °TIPS FOR EATING OUT °· Know your meal plan and how many carbohydrate servings you should have at each meal. You may wish to carry a copy of your meal plan in your purse or wallet. Learn the foods included in each food group. °· Make a list of restaurants near you that offer healthy choices. Take a copy of the carry-out menus to see what they offer. Then, you can plan what you will order ahead of time. °· Become familiar with serving sizes by practicing them at home using measuring cups and spoons. Once you learn to recognize portion sizes, you will be able to correctly estimate the amount of total carbohydrate you are allowed to eat at the restaurant. Ask for a takeout box if the portion is more than you should have. When your food comes, leave the amount you should have on the plate, and put the rest in the takeout box before you start eating. °· Plan ahead if your mealtime will be different from usual. Check with your caregiver to find out how to time meals and medicine if you are taking insulin. °· Avoid high-fat foods, such as fried foods, cream sauces, high-fat salad dressings, or any added butter or margarine. °· Do not be afraid to ask questions. Ask your server about the portion size, cooking methods, ingredients and if items can be substituted. Restaurants do not list all available items on the menu. You can ask for your main entree to be prepared using skim milk, oil instead of butter or margarine, and without gravy or sauces. Ask your waiter or waitress to serve salad dressings, gravy, sauces, margarine, and sour cream on the side. You can then add the amount your meal plan  suggests. °· Add more vegetables whenever possible. °· Avoid items that are labeled "jumbo," "giant," "deluxe," or "supersized." °· You may want to split an entrée with someone and order an extra side salad. °· Watch for hidden calories in foods like croutons, bacon, or cheese. °· Ask your server to take away the bread basket or chips from your table. °· Order a dinner salad as an appetizer. °You can eat most foods served in a restaurant. Some foods are better choices than others. °Breads and Starches °· Recommended: All kinds of bread (wheat, rye, white, oatmeal, Italian, French, raisin), hard or soft dinner rolls, frankfurter or hamburger buns, small bagels, small corn or whole-wheat flour tortillas. °· Avoid: Frosted or glazed breads, butter rolls, egg or cheese breads, croissants, sweet rolls, pastries, coffee cake, glazed or frosted doughnuts, muffins. °Crackers °· Recommended: Animal crackers, graham, rye, saltine, oyster, and matzoth crackers. Bread sticks, melba toast, rusks, pretzels, popcorn (without fat), zwieback toast. °· Avoid: High-fat snack crackers or chips. Buttered popcorn. °Cereals °· Recommended: Hot and cold cereals. Whole grains such as oatmeal or shredded wheat are good choices. °· Avoid: Sugar-coated or granola type cereals. °Potatoes/Pasta/Rice/Beans °· Recommended: Order baked, boiled, or mashed potatoes, rice or noodles without added fat, whole beans. Order gravies, butter, margarine, or sauces on the side so you can control the amount you add. °· Avoid: Hash browns or fried potatoes. Potatoes, pasta, or rice prepared with cream or   cheese sauce. Potato or pasta salads prepared with large amounts of dressing. Fried beans or fried rice. °Vegetables °· Recommended: Order steamed, baked, boiled, or stewed vegetables without sauces or extra fat. Ask that sauce be served on the side. If vegetables are not listed on the menu, ask what is available. °· Avoid: Vegetables prepared with cream,  butter, or cheese sauce. Fried vegetables. °Salad Bars °· Recommended: Many of the vegetables at a salad bar are considered "free." Use lemon juice, vinegar, or low-calorie salad dressing (fewer than 20 calories per serving) as "free" dressings for your salad. Look for salad bar ingredients that have no added fat or sugar such as tomatoes, lettuce, cucumbers, broccoli, carrots, onions, and mushrooms. °· Avoid: Prepared salads with large amounts of dressing, such as coleslaw, caesar salad, macaroni salad, bean salad, or carrot salad. °Fruit °· Recommended: Eat fresh fruit or fresh fruit salad without added dressing. A salad bar often offers fresh fruit choices, but canned fruit at a restaurant is usually packed in sugar or syrup. °· Avoid: Sweetened canned or frozen fruits, plain or sweetened fruit juice. Fruit salads with dressing, sour cream, or sugar added to them. °Meat and Meat Substitutes °· Recommended: Order broiled, baked, roasted, or grilled meat, poultry, or fish. Trim off all visible fat. Do not eat the skin of poultry. The size stated on the menu is the raw weight. Meat shrinks by ¼ in cooking (for example, 4 oz raw equals 3 oz cooked meat). °· Avoid: Deep-fat fried meat, poultry, or fish. Breaded meats. °Eggs °· Recommended: Order soft, hard-cooked, poached, or scrambled eggs. Omelets may be okay, depending on what ingredients are added. Egg substitutes are also a good choice. °· Avoid: Fried eggs, eggs prepared with cream or cheese sauce. °Milk °· Recommended: Order low-fat or fat-free milk according to your meal plan. Plain, nonfat yogurt or flavored yogurt with no sugar added may be used as a substitute for milk. Soy milk may also be used. °· Avoid: Milk shakes or sweetened milk beverages. °Soups and Combination Foods °· Recommended: Clear broth or consommé are "free" foods and may be used as an appetizer. Broth-based soups with fat removed count as a starch serving and are preferred over cream  soups. Soups made with beans or split peas may be eaten but count as a starch. °· Avoid: Fatty soups, soup made with cream, cheese soup. Combination foods prepared with excessive amounts of fat or with cream or cheese sauces. °Desserts and Sweets °· Recommended: Ask for fresh fruit. Sponge or angel food cake without icing, ice milk, no sugar added ice cream, sherbet, or frozen yogurt may fit into your meal plan occasionally. °· Avoid: Pastries, puddings, pies, cakes with icing, custard, gelatin desserts. °Fats and Oils °· Recommended: Choose healthy fats such as olive oil, canola oil, or tub margarine, reduced fat or fat-free sour cream, cream cheese, avocado, or nuts. °· Avoid: Any fats in excess of your allowed portion. Deep-fried foods or any food with a large amount of fat. °Note: Ask for all fats to be served on the side, and limit your portion sizes according to your meal plan. °Document Released: 02/07/2005 Document Revised: 05/02/2011 Document Reviewed: 08/28/2008 °[employer]® Patient Information ©2014 [employer]. ° °

## 2013-05-11 NOTE — Progress Notes (Addendum)
This chart was scribed for Kelly SidleKurt Lauenstein, MD by Nicholos Johnsenise Iheanachor, Medical Scribe. This patient's care was started at 4:58 PM  Patient ID: Kelly Fitzpatrick MRN: [id number], DOB: 1960/11/22, 53 y.o. Date of Encounter: 05/11/2013, 4:56 PM  Primary Physician: No PCP Per Patient  Chief Complaint: sugar check  HPI: 53 y.o. year old female with history below presents for sugar check. Has had Diabetes for the past 2 years. Average sugar is around 100. Pt is scheduled to have a 29th tooth extracted and a bone grafting procedure completed in 4 days. Says she wants to have her sugar under control by the procedure. Has had infections in her upper teeth in the past. Other than that, complains of no problems regarding her teeth. Also reports some leftover postnasal drip following a cold a few weeks ago. Pt states she has had problems in past regarding bacteria in the vagina and inquires to whether it is okay to continue treating that with garlic.  Past Medical History  Diagnosis Date   Diabetes mellitus    Hypercholesteremia    Endometriosis    Allergy    Anemia    Hypertension    Sickle cell trait      Home Meds: Prior to Admission medications   Medication Sig Start Date End Date Taking? Authorizing Provider  calcium gluconate 500 MG tablet Take 500 mg by mouth daily.   Yes Historical Provider, MD  cholecalciferol (VITAMIN D) 400 UNITS TABS Take 400 Units by mouth daily.   Yes Historical Provider, MD  Garlic 10 MG CAPS Take 1 tablet by mouth daily.   Yes Historical Provider, MD  glucose blood (ONE TOUCH ULTRA TEST) test strip Use as directed dx code 250.00 09/17/12  Yes Ryan M Dunn, PA-C  Lancets MISC To check blood sugar bid dx code 250.00 09/13/12  Yes Kelly SidleKurt Lauenstein, MD  loratadine-pseudoephedrine (CLARITIN-D 12-HOUR) 5-120 MG per tablet Take 1 tablet by mouth 2 (two) times daily as needed. For congestion and headache   Yes Historical Provider, MD  metFORMIN (GLUCOPHAGE) 500 MG tablet Take  1 tablet (500 mg total) by mouth daily. 06/20/12  Yes Kelly SidleKurt Lauenstein, MD  pravastatin (PRAVACHOL) 40 MG tablet Take 1 tablet (40 mg total) by mouth daily. 06/20/12  Yes Kelly SidleKurt Lauenstein, MD    Allergies:  Allergies  Allergen Reactions   Cephalexin Other (See Comments)    unknown   Amoxicillin Rash   Hyoscyamine Sulfate Rash   Sulfa Antibiotics Rash    History   Social History   Marital Status: Single    Spouse Name: N/A    Number of Children: N/A   Years of Education: N/A   Occupational History   Not on file.   Social History Main Topics   Smoking status: Never Smoker    Smokeless tobacco: Not on file   Alcohol Use: No   Drug Use: No   Sexual Activity:    Other Topics Concern   Not on file   Social History Narrative   No narrative on file     Review of Systems: Constitutional: negative for chills, fever, night sweats, weight changes, or fatigue  HEENT: negative for vision changes, hearing loss, congestion, rhinorrhea, ST, epistaxis, or sinus pressure. Positive for postnasal drip. Cardiovascular: negative for chest pain or palpitations Respiratory: negative for hemoptysis, wheezing, shortness of breath, or cough Abdominal: negative for abdominal pain, nausea, vomiting, diarrhea, or constipation Dermatological: negative for rash Neurologic: negative for headache, dizziness, or syncope All other systems reviewed  and are otherwise negative with the exception to those above and in the HPI.   Physical Exam: Blood pressure 120/80, pulse 77, temperature 97.9 F (36.6 C), temperature source Oral, resp. rate 16, height 5' 2.5" (1.588 m), weight 154 lb (69.854 kg), SpO2 98.00%., Body mass index is 27.7 kg/(m^2). General: Well developed, well nourished, in no acute distress. Head: Normocephalic, atraumatic, eyes without discharge, sclera non-icteric, nares are without discharge. Bilateral auditory canals clear, TM's are without perforation, pearly grey and  translucent with reflective cone of light bilaterally. Oral cavity moist, posterior pharynx without exudate, erythema, peritonsillar abscess. Clear fluid in the back of the throat.  Neck: Supple. No thyromegaly. Full ROM. No lymphadenopathy. Lungs: Clear bilaterally to auscultation without wheezes, rales, or rhonchi. Breathing is unlabored. Heart: RRR with S1 S2. No murmurs, rubs, or gallops appreciated. Abdomen: Soft, non-tender, non-distended with normoactive bowel sounds. No hepatomegaly. No rebound/guarding. No obvious abdominal masses. Msk:  Strength and tone normal for age. Extremities/Skin: Warm and dry. No clubbing or cyanosis. No edema. No rashes or suspicious lesions. Neuro: Alert and oriented X 3. Moves all extremities spontaneously. Gait is normal. CNII-XII grossly in tact. Psych:  Responds to questions appropriately with a normal affect. Musc: Pain in lower lumbar spine radiating into the left leg. Negative straight leg raise. Normal reflexes.   Labs:   Results for orders placed in visit on 06/20/12  COMPREHENSIVE METABOLIC PANEL      Result Value Ref Range   Sodium 139  135 - 145 mEq/L   Potassium 4.2  3.5 - 5.3 mEq/L   Chloride 104  96 - 112 mEq/L   CO2 26  19 - 32 mEq/L   Glucose, Bld 122 (*) 70 - 99 mg/dL   BUN 15  6 - 23 mg/dL   Creat 8.11  9.14 - 7.82 mg/dL   Total Bilirubin 0.5  0.3 - 1.2 mg/dL   Alkaline Phosphatase 66  39 - 117 U/L   AST 14  0 - 37 U/L   ALT 12  0 - 35 U/L   Total Protein 7.3  6.0 - 8.3 g/dL   Albumin 4.6  3.5 - 5.2 g/dL   Calcium 9.8  8.4 - 95.6 mg/dL  LIPID PANEL      Result Value Ref Range   Cholesterol 182  0 - 200 mg/dL   Triglycerides 64  <213 mg/dL   HDL 55  >08 mg/dL   Total CHOL/HDL Ratio 3.3     VLDL 13  0 - 40 mg/dL   LDL Cholesterol 657 (*) 0 - 99 mg/dL  MICROALBUMIN, URINE      Result Value Ref Range   Microalb, Ur 0.92  0.00 - 1.89 mg/dL  POCT GLYCOSYLATED HEMOGLOBIN (HGB A1C)      Result Value Ref Range   Hemoglobin A1C  6.7     Results for orders placed in visit on 05/11/13  POCT GLYCOSYLATED HEMOGLOBIN (HGB A1C)      Result Value Ref Range   Hemoglobin A1C 7.3       ASSESSMENT AND PLAN:  53 y.o. year old female with Diabetes - Plan: POCT glycosylated hemoglobin (Hb A1C), Microalbumin, urine  Pain in joint, lower leg  Sciatica  Diabetes mellitus - Plan: metFORMIN (GLUCOPHAGE) 500 MG tablet  Hyperlipidemia - Plan: pravastatin (PRAVACHOL) 40 MG tablet  Check CMET and lipids   Signed, Kelly Sidle, MD 05/11/2013 4:56 PM

## 2013-05-12 LAB — COMPREHENSIVE METABOLIC PANEL
ALT: 13 U/L (ref 0–35)
AST: 13 U/L (ref 0–37)
Albumin: 4.3 g/dL (ref 3.5–5.2)
Alkaline Phosphatase: 70 U/L (ref 39–117)
BUN: 12 mg/dL (ref 6–23)
CO2: 27 mEq/L (ref 19–32)
Calcium: 10.1 mg/dL (ref 8.4–10.5)
Chloride: 104 mEq/L (ref 96–112)
Creat: 0.78 mg/dL (ref 0.50–1.10)
Glucose, Bld: 137 mg/dL — ABNORMAL HIGH (ref 70–99)
Potassium: 4.3 mEq/L (ref 3.5–5.3)
Sodium: 140 mEq/L (ref 135–145)
Total Bilirubin: 0.5 mg/dL (ref 0.2–1.2)
Total Protein: 7.1 g/dL (ref 6.0–8.3)

## 2013-05-12 LAB — LIPID PANEL
Cholesterol: 155 mg/dL (ref 0–200)
HDL: 45 mg/dL (ref >39)
LDL Cholesterol: 96 mg/dL (ref 0–99)
Total CHOL/HDL Ratio: 3.4 Ratio
Triglycerides: 72 mg/dL (ref <150)
VLDL: 14 mg/dL (ref 0–40)

## 2013-05-12 LAB — MICROALBUMIN, URINE: Microalb, Ur: 1.09 mg/dL (ref 0.00–1.89)

## 2014-01-26 ENCOUNTER — Other Ambulatory Visit: Payer: Self-pay | Admitting: Physician Assistant

## 2014-02-17 ENCOUNTER — Telehealth: Payer: Self-pay

## 2014-02-17 MED ORDER — GLUCOSE BLOOD VI STRP: ORAL_STRIP | Status: DC

## 2014-02-17 NOTE — Telephone Encounter (Signed)
REQUESTING HER TEST STRIPS. PLEASE CALL HER WHEN IT HAS BEEN DONE. BEST PHONE [phone] (HOME)  PHARMACY CHOICE IS [facility] ON [address]  MBC

## 2014-02-17 NOTE — Telephone Encounter (Signed)
Sent in RF and notified pt. Advised her to RTC in next couple of weeks for f/up visit. Pt agreed.

## 2014-05-12 ENCOUNTER — Ambulatory Visit (INDEPENDENT_AMBULATORY_CARE_PROVIDER_SITE_OTHER): Payer: Federal, State, Local not specified - PPO | Admitting: Family Medicine

## 2014-05-12 VITALS — BP 126/72 | HR 74 | Temp 98.0°F | Resp 18 | Ht 62.0 in | Wt 153.0 lb

## 2014-05-12 DIAGNOSIS — H00016 Hordeolum externum left eye, unspecified eyelid: Secondary | ICD-10-CM | POA: Diagnosis not present

## 2014-05-12 DIAGNOSIS — E785 Hyperlipidemia, unspecified: Secondary | ICD-10-CM

## 2014-05-12 DIAGNOSIS — E119 Type 2 diabetes mellitus without complications: Secondary | ICD-10-CM | POA: Diagnosis not present

## 2014-05-12 MED ORDER — DOXYCYCLINE HYCLATE 100 MG PO TABS: 100.0000 mg | ORAL_TABLET | Freq: Two times a day (BID) | ORAL | Status: DC

## 2014-05-12 MED ORDER — PRAVASTATIN SODIUM 40 MG PO TABS: 40.0000 mg | ORAL_TABLET | Freq: Every day | ORAL | Status: DC

## 2014-05-12 MED ORDER — METFORMIN HCL 500 MG PO TABS: 500.0000 mg | ORAL_TABLET | Freq: Every day | ORAL | Status: DC

## 2014-05-12 NOTE — Progress Notes (Signed)
This is a 54 year old woman who presents with 2 weeks of irritation in the left lower eyelid and one week of swelling in the upper eyelid where the 2 come in contact. She's tried hot compresses.  Patient did have an episode of vomiting last night after eating [place] [employer]. She has no nausea at the present time.  Objective: Patient has a 3-4 mm upper eyelid swelling which is tender. Lower lid shows a 1 mm erythematous area at the eyelash border. The sclera are clear and extraocular motion is normal.   This chart was scribed in my presence and reviewed by me personally.    ICD-9-CM ICD-10-CM   1. Hordeolum externum, left 373.11 H00.016 doxycycline (VIBRA-TABS) 100 MG tablet  2. Type 2 diabetes mellitus without complication 250.00 E11.9 metFORMIN (GLUCOPHAGE) 500 MG tablet  3. Hyperlipidemia 272.4 E78.5 pravastatin (PRAVACHOL) 40 MG tablet     Signed, Elvina SidleKurt Jari Carollo, MD

## 2014-05-12 NOTE — Patient Instructions (Signed)

## 2014-06-07 ENCOUNTER — Ambulatory Visit (INDEPENDENT_AMBULATORY_CARE_PROVIDER_SITE_OTHER): Payer: Federal, State, Local not specified - PPO | Admitting: Emergency Medicine

## 2014-06-07 VITALS — BP 114/72 | HR 99 | Temp 97.9°F | Resp 24 | Ht 62.0 in | Wt 155.0 lb

## 2014-06-07 DIAGNOSIS — J302 Other seasonal allergic rhinitis: Secondary | ICD-10-CM | POA: Diagnosis not present

## 2014-06-07 DIAGNOSIS — L821 Other seborrheic keratosis: Secondary | ICD-10-CM | POA: Diagnosis not present

## 2014-06-07 DIAGNOSIS — H00016 Hordeolum externum left eye, unspecified eyelid: Secondary | ICD-10-CM | POA: Diagnosis not present

## 2014-06-07 MED ORDER — TRIAMCINOLONE ACETONIDE 55 MCG/ACT NA AERO: 2.0000 | INHALATION_SPRAY | Freq: Every day | NASAL | Status: DC

## 2014-06-07 MED ORDER — POLYMYXIN B-TRIMETHOPRIM 10000-0.1 UNIT/ML-% OP SOLN: 2.0000 [drp] | OPHTHALMIC | Status: DC

## 2014-06-07 NOTE — Patient Instructions (Signed)

## 2014-06-07 NOTE — Progress Notes (Signed)
[facility] [address], [place] [address] [phone]  Date:  06/07/2014   Name:  Kelly GellVeria D Delangel   DOB:  1960-03-02   MRN:  [id number]  PCP:  No PCP Per Patient    Chief Complaint: Eye Problem   History of Present Illness:  Kelly GellVeria D Vidaurri is a 54 y.o. very pleasant female patient who presents with the following:  Patient had styes of left upper and lower lids. Treated with doxycycline in late March Some improvement but still present No visual symptoms Has morning nasal congestion.  No drainage.   No fever or chills No cough No improvement with over the counter medications or other home remedies.  Denies other complaint or health concern today.   Patient Active Problem List   Diagnosis Date Noted  . Hyperlipidemia 06/20/2012  . Type II or unspecified type diabetes mellitus with neurological manifestations, not stated as uncontrolled 06/20/2012    Past Medical History  Diagnosis Date  . Diabetes mellitus   . Hypercholesteremia   . Endometriosis   . Allergy   . Anemia   . Hypertension   . Sickle cell trait     Past Surgical History  Procedure Laterality Date  . Abdominal hysterectomy    . Colostomy    . Colostomy closure      History  Substance Use Topics  . Smoking status: Never Smoker   . Smokeless tobacco: Not on file  . Alcohol Use: No    Family History  Problem Relation Age of Onset  . Hypertension Mother   . Cancer Mother     breast cancer  . Cancer Father     lung cancer  . Multiple sclerosis Brother   . Heart disease Brother     heart transplant    Allergies  Allergen Reactions  . Cephalexin Other (See Comments)    unknown  . Amoxicillin Rash  . Hyoscyamine Sulfate Rash  . Sulfa Antibiotics Rash    Medication list has been reviewed and updated.  Current Outpatient Prescriptions on File Prior to Visit  Medication Sig Dispense Refill  . calcium gluconate 500 MG tablet Take 500 mg by mouth daily.    .  cholecalciferol (VITAMIN D) 400 UNITS TABS Take 400 Units by mouth daily.    . Garlic 10 MG CAPS Take 1 tablet by mouth daily.    Marland Kitchen. glucose blood (ONE TOUCH ULTRA TEST) test strip Use as directed dx code E11.9. PATIENT NEEDS OFFICE VISIT FOR ADDITIONAL REFILLS 100 each 0  . Lancets MISC To check blood sugar bid dx code 250.00 100 each 2  . loratadine-pseudoephedrine (CLARITIN-D 12-HOUR) 5-120 MG per tablet Take 1 tablet by mouth 2 (two) times daily as needed. For congestion and headache    . metFORMIN (GLUCOPHAGE) 500 MG tablet Take 1 tablet (500 mg total) by mouth daily. 90 tablet 3  . pravastatin (PRAVACHOL) 40 MG tablet Take 1 tablet (40 mg total) by mouth daily. 90 tablet 3   No current facility-administered medications on file prior to visit.    Review of Systems:  As per HPI, otherwise negative.   Physical Examination: Filed Vitals:   06/07/14 1549  BP: 114/72  Pulse: 99  Temp: 97.9 F (36.6 C)  Resp: 24   Filed Vitals:   06/07/14 1549  Height: 5\' 2"  (1.575 m)  Weight: 155 lb (70.308 kg)   Body mass index is 28.34 kg/(m^2). Ideal Body Weight: Weight in (lb) to have BMI =  25: 136.4   GEN: WDWN, NAD, Non-toxic, Alert & Oriented x 3 HEENT: Atraumatic, Normocephalic.  Ears and Nose: No external deformity. EXTR: No clubbing/cyanosis/edema NEURO: Normal gait.  PSYCH: Normally interactive. Conversant. Not depressed or anxious appearing.  Calm demeanor.  Marginal left eye hordeolum.  PRRERLA EOMI conjunctiva and sclera clear.  Assessment and Plan: Seasonal allergic rhinitis Hordeolum Trimethoprim drops nasacort  Signed,  Phillips Odor, MD

## 2014-06-15 ENCOUNTER — Other Ambulatory Visit: Payer: Self-pay | Admitting: Family Medicine

## 2014-06-17 ENCOUNTER — Other Ambulatory Visit: Payer: Self-pay | Admitting: Family Medicine

## 2015-06-26 ENCOUNTER — Other Ambulatory Visit: Payer: Self-pay | Admitting: Family Medicine

## 2015-06-29 ENCOUNTER — Ambulatory Visit (INDEPENDENT_AMBULATORY_CARE_PROVIDER_SITE_OTHER): Payer: Federal, State, Local not specified - PPO | Admitting: Family Medicine

## 2015-06-29 VITALS — BP 118/78 | HR 80 | Temp 98.0°F | Resp 18 | Ht 62.0 in | Wt 151.8 lb

## 2015-06-29 DIAGNOSIS — R0789 Other chest pain: Secondary | ICD-10-CM

## 2015-06-29 DIAGNOSIS — E785 Hyperlipidemia, unspecified: Secondary | ICD-10-CM | POA: Diagnosis not present

## 2015-06-29 DIAGNOSIS — E119 Type 2 diabetes mellitus without complications: Secondary | ICD-10-CM | POA: Diagnosis not present

## 2015-06-29 DIAGNOSIS — B351 Tinea unguium: Secondary | ICD-10-CM

## 2015-06-29 LAB — POCT GLYCOSYLATED HEMOGLOBIN (HGB A1C): Hemoglobin A1C: 8.1

## 2015-06-29 LAB — COMPREHENSIVE METABOLIC PANEL
ALT: 16 U/L (ref 6–29)
AST: 17 U/L (ref 10–35)
Albumin: 4.3 g/dL (ref 3.6–5.1)
Alkaline Phosphatase: 73 U/L (ref 33–130)
BUN: 15 mg/dL (ref 7–25)
CO2: 25 mmol/L (ref 20–31)
Calcium: 9.7 mg/dL (ref 8.6–10.4)
Chloride: 105 mmol/L (ref 98–110)
Creat: 0.81 mg/dL (ref 0.50–1.05)
Glucose, Bld: 115 mg/dL — ABNORMAL HIGH (ref 65–99)
Potassium: 4.1 mmol/L (ref 3.5–5.3)
Sodium: 139 mmol/L (ref 135–146)
Total Bilirubin: 0.5 mg/dL (ref 0.2–1.2)
Total Protein: 7 g/dL (ref 6.1–8.1)

## 2015-06-29 LAB — LIPID PANEL
Cholesterol: 122 mg/dL — ABNORMAL LOW (ref 125–200)
HDL: 47 mg/dL (ref >=46)
LDL Cholesterol: 65 mg/dL (ref <130)
Total CHOL/HDL Ratio: 2.6 Ratio (ref <=5.0)
Triglycerides: 51 mg/dL (ref <150)
VLDL: 10 mg/dL (ref <30)

## 2015-06-29 LAB — GLUCOSE, POCT (MANUAL RESULT ENTRY): POC Glucose: 131 mg/dl — AB (ref 70–99)

## 2015-06-29 MED ORDER — TERBINAFINE HCL 250 MG PO TABS: 250.0000 mg | ORAL_TABLET | Freq: Every day | ORAL | Status: DC

## 2015-06-29 MED ORDER — PRAVASTATIN SODIUM 40 MG PO TABS: 40.0000 mg | ORAL_TABLET | Freq: Every day | ORAL | Status: DC

## 2015-06-29 MED ORDER — LISINOPRIL 5 MG PO TABS: 5.0000 mg | ORAL_TABLET | Freq: Every day | ORAL | Status: DC

## 2015-06-29 MED ORDER — LANCETS MISC: Status: DC

## 2015-06-29 MED ORDER — GLUCOSE BLOOD VI STRP: ORAL_STRIP | Status: DC

## 2015-06-29 MED ORDER — METFORMIN HCL 500 MG PO TABS: 500.0000 mg | ORAL_TABLET | Freq: Every day | ORAL | Status: DC

## 2015-06-29 MED ORDER — METFORMIN HCL 500 MG PO TABS: 500.0000 mg | ORAL_TABLET | Freq: Two times a day (BID) | ORAL | Status: DC

## 2015-06-29 NOTE — Patient Instructions (Addendum)
Consider looking up the American diabetic Association website to read more  Add lisinopril 5 mg 1 daily to your regimen to help protect the diabetic kidney  Increase the metformin to 500 mg twice daily  Take aspirin 81 mg 1 daily  Exercise more and eat less, trying to eat more of the right things  Because we are changing her metformin dose and you may need to be titrated upward with this even further or have another medication added to try and get the hemoglobin A1c below 7, I recommend you return in 3 or 4 months for one more recheck.  Return at anytime as needed  Do good footcare  Take the terbinafine 250 mg 1 pill daily for one month. At the end of that time you need to return to get your liver enzymes rechecked before you can be given a prescription for the next 2 months.    IF you received an x-ray today, you will receive an invoice from [facility]. Please contact [facility] at [phone] with questions or concerns regarding your invoice.   IF you received labwork today, you will receive an invoice from [facility]. Please contact [facility] at [phone] with questions or concerns regarding your invoice.   Our billing staff will not be able to assist you with questions regarding bills from these companies.  You will be contacted with the lab results as soon as they are available. The fastest way to get your results is to activate your My Chart account. Instructions are located on the last page of this paperwork. If you have not heard from us regarding the results in 2 weeks, please contact this office.

## 2015-06-29 NOTE — Progress Notes (Signed)
Patient ID: Kelly GellVeria D Fitzpatrick, female    DOB: 26-Nov-1960  Age: 55 y.o. MRN: [id number]  Chief Complaint  Patient presents with  . Bloodwork    a1c and lipids check    Subjective:     Current allergies, medications, problem list, past/family and social histories reviewed.  Objective:  BP 118/78 mmHg  Pulse 80  Temp(Src) 98 F (36.7 C) (Oral)  Resp 18  Ht 5\' 2"  (1.575 m)  Wt 151 lb 12.8 oz (68.856 kg)  BMI 27.76 kg/m2  SpO2 98%    Assessment & Plan:   Assessment: 1. Hyperlipidemia   2. Type 2 diabetes mellitus without complication, without long-term current use of insulin (HCC)   3. Atypical chest pain   4. Onychomycosis       Plan:   Orders Placed This Encounter  Procedures  . Comprehensive metabolic panel  . Microalbumin, urine  . Lipid panel  . Hepatic function panel    Standing Status: Future     Number of Occurrences:      Standing Expiration Date: 08/29/2015  . POCT glucose (manual entry)  . POCT glycosylated hemoglobin (Hb A1C)  . EKG 12-Lead    Meds ordered this encounter  Medications  . KRILL OIL PO    Sig: Take by mouth.  Marland Kitchen. lisinopril (PRINIVIL,ZESTRIL) 5 MG tablet    Sig: Take 1 tablet (5 mg total) by mouth daily.    Dispense:  90 tablet    Refill:  3  . Lancets MISC    Sig: To check blood sugar bid dx code 250.00    Dispense:  100 each    Refill:  2  . glucose blood (ONE TOUCH ULTRA TEST) test strip    Sig: Use as directed dx code E11.9. PATIENT NEEDS OFFICE VISIT FOR ADDITIONAL REFILLS    Dispense:  100 each    Refill:  0    Fill with test strips of pt/insurance preference.  Marland Kitchen. DISCONTD: metFORMIN (GLUCOPHAGE) 500 MG tablet    Sig: Take 1 tablet (500 mg total) by mouth daily.    Dispense:  90 tablet    Refill:  3  . pravastatin (PRAVACHOL) 40 MG tablet    Sig: Take 1 tablet (40 mg total) by mouth daily.    Dispense:  90 tablet    Refill:  3  . terbinafine (LAMISIL) 250 MG tablet    Sig: Take 1 tablet (250 mg total) by mouth daily.     Dispense:  30 tablet    Refill:  0  . metFORMIN (GLUCOPHAGE) 500 MG tablet    Sig: Take 1 tablet (500 mg total) by mouth 2 (two) times daily with a meal.    Dispense:  180 tablet    Refill:  3    I had sent through a prescription for taking this once daily. Please change and use the twice daily prescription         Patient Instructions   Consider looking up the American diabetic Association website to read more  Add lisinopril 5 mg 1 daily to your regimen to help protect the diabetic kidney  Increase the metformin to 500 mg twice daily  Take aspirin 81 mg 1 daily  Exercise more and eat less, trying to eat more of the right things  Because we are changing her metformin dose and you may need to be titrated upward with this even further or have another medication added to try and get the hemoglobin A1c below 7, I  recommend you return in 3 or 4 months for one more recheck.  Return at anytime as needed  Do good footcare  Take the terbinafine 250 mg 1 pill daily for one month. At the end of that time you need to return to get your liver enzymes rechecked before you can be given a prescription for the next 2 months.    IF you received an x-ray today, you will receive an invoice from [facility]. Please contact [facility] at [phone] with questions or concerns regarding your invoice.   IF you received labwork today, you will receive an invoice from [facility]. Please contact [facility] at [phone] with questions or concerns regarding your invoice.   Our billing staff will not be able to assist you with questions regarding bills from these companies.  You will be contacted with the lab results as soon as they are available. The fastest way to get your results is to activate your My Chart account. Instructions are located on the last page of this paperwork. If you have not heard from [place] regarding the results in 2 weeks, please  contact this office.          Return in about 1 month (around 07/30/2015), or if symptoms worsen or fail to improve.   Arnetia Bronk, MD 06/29/2015

## 2015-06-29 NOTE — Progress Notes (Signed)
Patient ID: Kelly Fitzpatrick, female    DOB: 1960/12/15  Age: 55 y.o. MRN: [id number]  Chief Complaint  Patient presents with  . Bloodwork    a1c and lipids check    Subjective:   Patient is here for several things. She is diabetic and needs to have recheck of her diabetes and lipids to get medicines refilled. She has been doing pretty well. She brought in her meter. Most of her sugars have been fair. She has had a couple that spike over 200 however. She feels well. She is had some stress in visiting daily her brother who has ALS. She also helps care for her mom. She has a toenail on her right foot that was infected recently and had pus draining out from under it, that has now resolved. It is a thickened toenail which had to be partially removed once before. She would like it treated. She does not get a lot of regular exercise. Eats out for many of her meals. She is single. Her mom cooks the meals that she does eat at home. She works for service, fairly sedentary work.  Current allergies, medications, problem list, past/family and social histories reviewed.  Objective:  BP 118/78 mmHg  Pulse 80  Temp(Src) 98 F (36.7 C) (Oral)  Resp 18  Ht  (1.575 m)  Wt 151 lb 12.8 oz (68.856 kg)  BMI 27.76 kg/m2  SpO2 98%  No major acute distress. HEENT normal. Chest clear. Heart regular without murmurs. Abdomen soft. Extremities unremarkable with good pulses. Sensory with filament were normal. She has a dystrophic nail with a large toe of her right foot, appears fungal.  Assessment & Plan:   Assessment: 1. Hyperlipidemia   2. Type 2 diabetes mellitus without complication, without long-term current use of insulin (HCC)   3. Atypical chest pain   4. Onychomycosis       Plan: Check orders. We'll let her know the results of the send out orders. Discussed terbinafine, and we will give her 1 months of that. She is to get labs rechecked in one month. Increase the metformin to twice daily from  once daily. May need to eventually go up to 1000 mg twice daily and/or add something else, with a desire of getting the hemoglobin A1c between 6 and 7.  Orders Placed This Encounter  Procedures  . Comprehensive metabolic panel  . Microalbumin, urine  . Lipid panel  . Hepatic function panel    Standing Status: Future     Number of Occurrences:      Standing Expiration Date: 08/29/2015  . POCT glucose (manual entry)  . POCT glycosylated hemoglobin (Hb A1C)  . EKG 12-Lead    Meds ordered this encounter  Medications  . KRILL OIL PO    Sig: Take by mouth.  Marland Kitchen lisinopril (PRINIVIL,ZESTRIL) 5 MG tablet    Sig: Take 1 tablet (5 mg total) by mouth daily.    Dispense:  90 tablet    Refill:  3  . Lancets MISC    Sig: To check blood sugar bid dx code 250.00    Dispense:  100 each    Refill:  2  . glucose blood (ONE TOUCH ULTRA TEST) test strip    Sig: Use as directed dx code E11.9. PATIENT NEEDS OFFICE VISIT FOR ADDITIONAL REFILLS    Dispense:  100 each    Refill:  0    Fill with test strips of pt/insurance preference.  . metFORMIN (GLUCOPHAGE) 500 MG tablet  Sig: Take 1 tablet (500 mg total) by mouth daily.    Dispense:  90 tablet    Refill:  3  . pravastatin (PRAVACHOL) 40 MG tablet    Sig: Take 1 tablet (40 mg total) by mouth daily.    Dispense:  90 tablet    Refill:  3  . terbinafine (LAMISIL) 250 MG tablet    Sig: Take 1 tablet (250 mg total) by mouth daily.    Dispense:  30 tablet    Refill:  0    Results for orders placed or performed in visit on 06/29/15  POCT glucose (manual entry)  Result Value Ref Range   POC Glucose 131 (A) 70 - 99 mg/dl  POCT glycosylated hemoglobin (Hb A1C)  Result Value Ref Range   Hemoglobin A1C 8.1         Patient Instructions   Consider looking up the American diabetic Association website to read more  Add lisinopril 5 mg 1 daily to your regimen to help protect the diabetic kidney  Increase the metformin to 500 mg twice  daily  Take aspirin 81 mg 1 daily  Exercise more and eat less, trying to eat more of the right things  Because we are changing her metformin dose and you may need to be titrated upward with this even further or have another medication added to try and get the hemoglobin A1c below 7, I recommend you return in 3 or 4 months for one more recheck.  Return at anytime as needed  Do good footcare  Take the terbinafine 250 mg 1 pill daily for one month. At the end of that time you need to return to get your liver enzymes rechecked before you can be given a prescription for the next 2 months.    IF you received an x-ray today, you will receive an invoice from [facility]. Please contact [facility] at [phone] with questions or concerns regarding your invoice.   IF you received labwork today, you will receive an invoice from [facility]. Please contact [facility] at [phone] with questions or concerns regarding your invoice.   Our billing staff will not be able to assist you with questions regarding bills from these companies.  You will be contacted with the lab results as soon as they are available. The fastest way to get your results is to activate your My Chart account. Instructions are located on the last page of this paperwork. If you have not heard from us regarding the results in 2 weeks, please contact this office.          Return in about 1 month (around 07/30/2015), or if symptoms worsen or fail to improve.   Other Atienza, MD 06/29/2015

## 2015-06-30 LAB — MICROALBUMIN, URINE: Microalb, Ur: 0.5 mg/dL

## 2015-07-01 ENCOUNTER — Encounter: Payer: Self-pay | Admitting: *Deleted

## 2015-07-07 ENCOUNTER — Encounter (HOSPITAL_COMMUNITY): Payer: Self-pay | Admitting: Emergency Medicine

## 2015-07-07 ENCOUNTER — Emergency Department (HOSPITAL_COMMUNITY)
Admission: EM | Admit: 2015-07-07 | Discharge: 2015-07-07 | Disposition: A | Discharge status: Home / Self Care | Payer: Federal, State, Local not specified - PPO | Attending: Emergency Medicine | Admitting: Emergency Medicine

## 2015-07-07 DIAGNOSIS — Z862 Personal history of diseases of the blood and blood-forming organs and certain disorders involving the immune mechanism: Secondary | ICD-10-CM | POA: Diagnosis not present

## 2015-07-07 DIAGNOSIS — Z79899 Other long term (current) drug therapy: Secondary | ICD-10-CM | POA: Insufficient documentation

## 2015-07-07 DIAGNOSIS — Z7951 Long term (current) use of inhaled steroids: Secondary | ICD-10-CM | POA: Insufficient documentation

## 2015-07-07 DIAGNOSIS — Z7984 Long term (current) use of oral hypoglycemic drugs: Secondary | ICD-10-CM | POA: Diagnosis not present

## 2015-07-07 DIAGNOSIS — E7800 Pure hypercholesterolemia, unspecified: Secondary | ICD-10-CM | POA: Insufficient documentation

## 2015-07-07 DIAGNOSIS — Z8742 Personal history of other diseases of the female genital tract: Secondary | ICD-10-CM | POA: Diagnosis not present

## 2015-07-07 DIAGNOSIS — R112 Nausea with vomiting, unspecified: Secondary | ICD-10-CM | POA: Diagnosis not present

## 2015-07-07 DIAGNOSIS — Z880 Allergy status to penicillin: Secondary | ICD-10-CM | POA: Insufficient documentation

## 2015-07-07 DIAGNOSIS — R531 Weakness: Secondary | ICD-10-CM | POA: Diagnosis not present

## 2015-07-07 DIAGNOSIS — R231 Pallor: Secondary | ICD-10-CM | POA: Diagnosis not present

## 2015-07-07 DIAGNOSIS — E119 Type 2 diabetes mellitus without complications: Secondary | ICD-10-CM | POA: Diagnosis not present

## 2015-07-07 DIAGNOSIS — I10 Essential (primary) hypertension: Secondary | ICD-10-CM | POA: Insufficient documentation

## 2015-07-07 LAB — CBC WITH DIFFERENTIAL/PLATELET
Basophils Absolute: 0 10*3/uL (ref 0.0–0.1)
Basophils Relative: 0 %
Eosinophils Absolute: 0 10*3/uL (ref 0.0–0.7)
Eosinophils Relative: 0 %
HCT: 41.2 % (ref 36.0–46.0)
Hemoglobin: 13.7 g/dL (ref 12.0–15.0)
Lymphocytes Relative: 11 %
Lymphs Abs: 0.7 10*3/uL (ref 0.7–4.0)
MCH: 26 pg (ref 26.0–34.0)
MCHC: 33.3 g/dL (ref 30.0–36.0)
MCV: 78.3 fL (ref 78.0–100.0)
Monocytes Absolute: 0.5 10*3/uL (ref 0.1–1.0)
Monocytes Relative: 7 %
Neutro Abs: 5.7 10*3/uL (ref 1.7–7.7)
Neutrophils Relative %: 82 %
Platelets: 192 10*3/uL (ref 150–400)
RBC: 5.26 MIL/uL — ABNORMAL HIGH (ref 3.87–5.11)
RDW: 14.3 % (ref 11.5–15.5)
WBC: 7 10*3/uL (ref 4.0–10.5)

## 2015-07-07 LAB — URINALYSIS, ROUTINE W REFLEX MICROSCOPIC
Bilirubin Urine: NEGATIVE
Glucose, UA: NEGATIVE mg/dL
Hgb urine dipstick: NEGATIVE
Ketones, ur: 15 mg/dL — AB
Leukocytes, UA: NEGATIVE
Nitrite: NEGATIVE
Protein, ur: NEGATIVE mg/dL
Specific Gravity, Urine: 1.018 (ref 1.005–1.030)
pH: 8.5 — ABNORMAL HIGH (ref 5.0–8.0)

## 2015-07-07 LAB — COMPREHENSIVE METABOLIC PANEL
ALT: 18 U/L (ref 14–54)
AST: 20 U/L (ref 15–41)
Albumin: 4.2 g/dL (ref 3.5–5.0)
Alkaline Phosphatase: 73 U/L (ref 38–126)
Anion gap: 16 — ABNORMAL HIGH (ref 5–15)
BUN: 14 mg/dL (ref 6–20)
CO2: 24 mmol/L (ref 22–32)
Calcium: 10.7 mg/dL — ABNORMAL HIGH (ref 8.9–10.3)
Chloride: 101 mmol/L (ref 101–111)
Creatinine, Ser: 0.98 mg/dL (ref 0.44–1.00)
GFR calc Af Amer: >60 mL/min (ref >60)
GFR calc non Af Amer: >60 mL/min (ref >60)
Glucose, Bld: 159 mg/dL — ABNORMAL HIGH (ref 65–99)
Potassium: 3.8 mmol/L (ref 3.5–5.1)
Sodium: 141 mmol/L (ref 135–145)
Total Bilirubin: 0.6 mg/dL (ref 0.3–1.2)
Total Protein: 7.4 g/dL (ref 6.5–8.1)

## 2015-07-07 LAB — LIPASE, BLOOD: Lipase: 32 U/L (ref 11–51)

## 2015-07-07 MED ORDER — SODIUM CHLORIDE 0.9 % IV BOLUS (SEPSIS)
1000.0000 mL | Freq: Once | INTRAVENOUS | Status: AC
  Administered 2015-07-07: 1000 mL via INTRAVENOUS

## 2015-07-07 MED ORDER — ONDANSETRON 4 MG PO TBDP: 4.0000 mg | ORAL_TABLET | Freq: Three times a day (TID) | ORAL | Status: DC | PRN

## 2015-07-07 MED ORDER — ONDANSETRON HCL 4 MG/2ML IJ SOLN
4.0000 mg | Freq: Once | INTRAMUSCULAR | Status: AC
  Administered 2015-07-07: 4 mg via INTRAVENOUS
  Filled 2015-07-07: qty 2

## 2015-07-07 NOTE — ED Provider Notes (Signed)
CSN: [id number]     Arrival date & time 07/07/15  0718 History   First MD Initiated Contact with Patient 07/07/15 [phone]     Chief Complaint  Patient presents with  . Nausea  . Emesis  . Weakness     HPI  Kelly Fitzpatrick is an 55 y.o. female with history of DM (on metformin), HTN, HLD who presents to the ED for evaluation of N/V. She states it started around 2 AM this morning. She states she works third shift and last night she ate some fried shrimp around midnight and she thinks that might be what is causing her symptoms. She states she has had 10+ episodes of NBNB emesis over the past six hours. Denies diarrhea. Denies abdominal pain. Denies hematemesis. She has not tried anything to alleviate her symptoms.   Past Medical History  Diagnosis Date  . Diabetes mellitus   . Hypercholesteremia   . Endometriosis   . Allergy   . Anemia   . Hypertension   . Sickle cell trait [facility])    Past Surgical History  Procedure Laterality Date  . Abdominal hysterectomy    . Colostomy    . Colostomy closure     Family History  Problem Relation Age of Onset  . Hypertension Mother   . Cancer Mother     breast cancer  . Cancer Father     lung cancer  . Multiple sclerosis Brother   . Heart disease Brother     heart transplant   Social History  Substance Use Topics  . Smoking status: Never Smoker   . Smokeless tobacco: None  . Alcohol Use: No   OB History    No data available     Review of Systems  All other systems reviewed and are negative.     Allergies  Cephalexin; Amoxicillin; Hyoscyamine sulfate; and Sulfa antibiotics  Home Medications   Prior to Admission medications   Medication Sig Start Date End Date Taking? Authorizing Provider  calcium gluconate 500 MG tablet Take 500 mg by mouth daily. Reported on 06/29/2015    Historical Provider, MD  cholecalciferol (VITAMIN D) 400 UNITS TABS Take 400 Units by mouth daily.    Historical Provider, MD  Garlic 10 MG CAPS Take 1  tablet by mouth daily.    Historical Provider, MD  glucose blood (ONE TOUCH ULTRA TEST) test strip Use as directed dx code E11.9. PATIENT NEEDS OFFICE VISIT FOR ADDITIONAL REFILLS 06/29/15   Peyton Najjar, MD  KRILL OIL PO Take by mouth.    Historical Provider, MD  Lancets MISC To check blood sugar bid dx code 250.00 06/29/15   Peyton Najjar, MD  lisinopril (PRINIVIL,ZESTRIL) 5 MG tablet Take 1 tablet (5 mg total) by mouth daily. 06/29/15   Peyton Najjar, MD  loratadine-pseudoephedrine (CLARITIN-D 12-HOUR) 5-120 MG per tablet Take 1 tablet by mouth 2 (two) times daily as needed. Reported on 06/29/2015    Historical Provider, MD  metFORMIN (GLUCOPHAGE) 500 MG tablet Take 1 tablet (500 mg total) by mouth 2 (two) times daily with a meal. 06/29/15   Peyton Najjar, MD  ondansetron (ZOFRAN ODT) 4 MG disintegrating tablet Take 1 tablet (4 mg total) by mouth every 8 (eight) hours as needed for nausea or vomiting. 07/07/15   Ace Gins Shaquill Iseman, PA-C  pravastatin (PRAVACHOL) 40 MG tablet Take 1 tablet (40 mg total) by mouth daily. 06/29/15   Peyton Najjar, MD  terbinafine (LAMISIL) 250 MG tablet Take  1 tablet (250 mg total) by mouth daily. 06/29/15   Peyton Najjaravid H Hopper, MD  triamcinolone (NASACORT AQ) 55 MCG/ACT AERO nasal inhaler Place 2 sprays into the nose daily. 06/07/14   Carmelina DaneJeffery S Anderson, MD  trimethoprim-polymyxin b (POLYTRIM) ophthalmic solution Place 2 drops into the left eye every 4 (four) hours. Patient not taking: Reported on 06/29/2015 06/07/14   Carmelina DaneJeffery S Anderson, MD   BP 112/74 mmHg  Pulse 80  Temp(Src) 97.9 F (36.6 C) (Oral)  Resp 19  SpO2 100% Physical Exam  Constitutional: She is oriented to person, place, and time.  Appears uncomfortable but NAD  HENT:  Right Ear: External ear normal.  Left Ear: External ear normal.  Nose: Nose normal.  Mouth/Throat: Oropharynx is clear and moist. No oropharyngeal exudate.  Eyes: Conjunctivae and EOM are normal. Pupils are equal, round, and reactive to light.   Neck: Normal range of motion. Neck supple.  Cardiovascular: Normal rate, regular rhythm, normal heart sounds and intact distal pulses.   Pulmonary/Chest: Effort normal and breath sounds normal. No respiratory distress. She has no wheezes. She exhibits no tenderness.  Abdominal: Soft. Bowel sounds are normal. She exhibits no distension. There is no tenderness. There is no rebound and no guarding.  Musculoskeletal: She exhibits no edema.  Neurological: She is alert and oriented to person, place, and time. No cranial nerve deficit.  Skin: Skin is warm and dry. There is pallor.  Psychiatric: She has a normal mood and affect.  Nursing note and vitals reviewed.  Filed Vitals:   07/07/15 0800 07/07/15 0804 07/07/15 0845 07/07/15 0912  BP: 120/63 120/63 109/68 112/74  Pulse: 82 80 161 80  Temp:    97.9 F (36.6 C)  TempSrc:    Oral  Resp: 21 12 14 19   SpO2: 100% 100% 100% 100%     ED Course  Procedures (including critical care time) Labs Review Labs Reviewed  COMPREHENSIVE METABOLIC PANEL - Abnormal; Notable for the following:    Glucose, Bld 159 (*)    Calcium 10.7 (*)    Anion gap 16 (*)    All other components within normal limits  CBC WITH DIFFERENTIAL/PLATELET - Abnormal; Notable for the following:    RBC 5.26 (*)    All other components within normal limits  URINALYSIS, ROUTINE W REFLEX MICROSCOPIC (NOT AT [facility]) - Abnormal; Notable for the following:    pH 8.5 (*)    Ketones, ur 15 (*)    All other components within normal limits  LIPASE, BLOOD    Imaging Review No results found. I have personally reviewed and evaluated these images and lab results as part of my medical decision-making.   EKG Interpretation None      MDM   Final diagnoses:  Non-intractable vomiting with nausea, vomiting of unspecified type    Labs with gap of 16, 15 ketones in urine, otherwise unremarkable. Pt rehydrated with 1L NS bolus and given zofran. She feels much improved. On repeat exam  she states nausea is gone. Her abdominal exam remains benign and she is nontoxic appearing. Suspect gastroenteritis, possibly from the shrimp last night. Will give rx for zofran as needed. She is tolerating PO and stable for discharge. Instructed PCP f/u. ER return precautions given.    Carlene CoriaSerena Y Lucendia Leard, PA-C 07/07/15 [id number]  Leta BaptistEmily Roe Nguyen, MD 07/09/15 [phone]

## 2015-07-07 NOTE — Discharge Instructions (Signed)

## 2015-07-07 NOTE — ED Notes (Signed)
Pt arrives from home with c/o n/v/weakness that began this morning around 0200. Denies fever, diarrhea or CP. Pt reports eating late night meal of shrimp and ribs, woke up at 2am and has vomitted at least 10x's since. Pt is a diabetic, takes Metformin twice daily.

## 2015-09-03 NOTE — Progress Notes (Signed)
Call patient: She had been given a 1 month start of terbinafine for her toenail fungus, was to return for liver blood tests before continuing the course of meds for 2 more months.  If she took it she needs to get the blood done as directed.  If she did not take it she doesn't need labs.

## 2015-09-25 ENCOUNTER — Ambulatory Visit (INDEPENDENT_AMBULATORY_CARE_PROVIDER_SITE_OTHER): Payer: Federal, State, Local not specified - PPO | Admitting: Family Medicine

## 2015-09-25 VITALS — BP 118/72 | HR 82 | Temp 99.0°F | Resp 16 | Ht 62.0 in | Wt 144.0 lb

## 2015-09-25 DIAGNOSIS — E785 Hyperlipidemia, unspecified: Secondary | ICD-10-CM | POA: Diagnosis not present

## 2015-09-25 DIAGNOSIS — L601 Onycholysis: Secondary | ICD-10-CM

## 2015-09-25 DIAGNOSIS — E119 Type 2 diabetes mellitus without complications: Secondary | ICD-10-CM

## 2015-09-25 LAB — LIPID PANEL
Cholesterol: 201 mg/dL — ABNORMAL HIGH (ref 125–200)
HDL: 53 mg/dL (ref >=46)
LDL Cholesterol: 134 mg/dL — ABNORMAL HIGH (ref <130)
Total CHOL/HDL Ratio: 3.8 Ratio (ref <=5.0)
Triglycerides: 69 mg/dL (ref <150)
VLDL: 14 mg/dL (ref <30)

## 2015-09-25 LAB — COMPLETE METABOLIC PANEL WITH GFR
ALT: 10 U/L (ref 6–29)
AST: 12 U/L (ref 10–35)
Albumin: 4.1 g/dL (ref 3.6–5.1)
Alkaline Phosphatase: 64 U/L (ref 33–130)
BUN: 18 mg/dL (ref 7–25)
CO2: 25 mmol/L (ref 20–31)
Calcium: 9.3 mg/dL (ref 8.6–10.4)
Chloride: 104 mmol/L (ref 98–110)
Creat: 0.85 mg/dL (ref 0.50–1.05)
GFR, Est African American: 88 mL/min (ref >=60)
GFR, Est Non African American: 76 mL/min (ref >=60)
Glucose, Bld: 119 mg/dL — ABNORMAL HIGH (ref 65–99)
Potassium: 3.9 mmol/L (ref 3.5–5.3)
Sodium: 139 mmol/L (ref 135–146)
Total Bilirubin: 0.3 mg/dL (ref 0.2–1.2)
Total Protein: 6.5 g/dL (ref 6.1–8.1)

## 2015-09-25 LAB — HEMOGLOBIN A1C
Hgb A1c MFr Bld: 7 % — ABNORMAL HIGH (ref <5.7)
Mean Plasma Glucose: 154 mg/dL

## 2015-09-25 NOTE — Progress Notes (Signed)
Subjective:  By signing my name below, I, Kelly Fitzpatrick, attest that this documentation has been prepared under the direction and in the presence of Meredith Staggers, MD.  Electronically Signed: Andrew Au, ED Scribe. 09/23/2015. 10:15 AM.   Patient ID: Kelly Fitzpatrick, female    DOB: [birth year], 55 y.o.   MRN: [id number]  HPI Chief Complaint  Patient presents with  . Ingrown Toenail    right great toe, also would like blood work     HPI Comments: Kelly Fitzpatrick is a 55 y.o. female who presents to the [facility] complaining of right great toe ingrown toenail. She had signs of infection in right great toe nail with pus drainage in the past, noted at visit with Dr. Alwyn Ren 5/08 but was resolved at that time. She had partial nail removed in the past. She was prescribes Lamisil but did not try medication after hearing possible side effects of medication. Pt has noticed a new toenail growing underneath her toenail. She reports discomfort to toenail when wearing closed toed shoes. She has not notice recent pus drainage.   Diabetes Pt would also like to have routine blood work. Pt was seen for DM by Dr. Alwyn Ren, 06/29/15. She had an increase in Metformin and has been taking 500mg  twice a day.  She has been trying to lose weight, which she has done through dietary change.   Lab Results  Component Value Date   HGBA1C 8.1 06/29/2015   Wt Readings from Last 3 Encounters:  09/25/15 144 lb (65.3 kg)  06/29/15 151 lb 12.8 oz (68.9 kg)  06/07/14 155 lb (70.3 kg)   Hyperlipidemia Lab Results  Component Value Date   CHOL 122 (L) 06/29/2015   HDL 47 06/29/2015   LDLCALC 65 06/29/2015   TRIG 51 06/29/2015   CHOLHDL 2.6 06/29/2015   Lab Results  Component Value Date   ALT 18 07/07/2015   AST 20 07/07/2015   ALKPHOS 73 07/07/2015   BILITOT 0.6 07/07/2015   She stopped cholesterol medication 2 weeks after her visit with Dr. Alwyn Ren.   Patient Active Problem List   Diagnosis  Date Noted  . Hyperlipidemia 06/20/2012  . Diabetes (HCC) 06/20/2012   Past Medical History:  Diagnosis Date  . Allergy   . Anemia   . Diabetes mellitus   . Endometriosis   . Hypercholesteremia   . Hypertension   . Sickle cell trait [facility])    Past Surgical History:  Procedure Laterality Date  . ABDOMINAL HYSTERECTOMY    . COLOSTOMY    . COLOSTOMY CLOSURE     Allergies  Allergen Reactions  . Cephalexin Other (See Comments)    unknown  . Amoxicillin Rash  . Hyoscyamine Sulfate Rash  . Sulfa Antibiotics Rash   Prior to Admission medications   Medication Sig Start Date End Date Taking? Authorizing Provider  calcium gluconate 500 MG tablet Take 500 mg by mouth daily. Reported on 06/29/2015   Yes Historical Provider, MD  cholecalciferol (VITAMIN D) 400 UNITS TABS Take 400 Units by mouth daily.   Yes Historical Provider, MD  Garlic 10 MG CAPS Take 1 tablet by mouth daily.   Yes Historical Provider, MD  glucose blood (ONE TOUCH ULTRA TEST) test strip Use as directed dx code E11.9. PATIENT NEEDS OFFICE VISIT FOR ADDITIONAL REFILLS 06/29/15  Yes Peyton Najjar, MD  KRILL OIL PO Take by mouth.   Yes Historical Provider, MD  Lancets MISC To check blood sugar bid  dx code 250.00 06/29/15  Yes Peyton Najjar, MD  lisinopril (PRINIVIL,ZESTRIL) 5 MG tablet Take 1 tablet (5 mg total) by mouth daily. 06/29/15  Yes Peyton Najjar, MD  loratadine-pseudoephedrine (CLARITIN-D 12-HOUR) 5-120 MG per tablet Take 1 tablet by mouth 2 (two) times daily as needed. Reported on 06/29/2015   Yes Historical Provider, MD  metFORMIN (GLUCOPHAGE) 500 MG tablet Take 1 tablet (500 mg total) by mouth 2 (two) times daily with a meal. 06/29/15  Yes Peyton Najjar, MD  pravastatin (PRAVACHOL) 40 MG tablet Take 1 tablet (40 mg total) by mouth daily. Patient not taking: Reported on 09/25/2015 06/29/15   Peyton Najjar, MD   Social History   Social History  . Marital status: Single    Spouse name: N/A  . Number of children: N/A  .  Years of education: N/A   Occupational History  . Not on file.   Social History Main Topics  . Smoking status: Never Smoker  . Smokeless tobacco: Not on file  . Alcohol use No  . Drug use: No  . Sexual activity: Not on file   Other Topics Concern  . Not on file   Social History Narrative  . No narrative on file   Review of Systems  Constitutional: Negative for fatigue and unexpected weight change.  Respiratory: Negative for chest tightness and shortness of breath.   Cardiovascular: Negative for chest pain, palpitations and leg swelling.  Gastrointestinal: Negative for abdominal distention, abdominal pain, blood in stool and diarrhea.  Neurological: Negative for dizziness, syncope, light-headedness and headaches.       Objective:   Physical Exam  Constitutional: She is oriented to person, place, and time. She appears well-developed and well-nourished.  HENT:  Head: Normocephalic and atraumatic.  Eyes: Conjunctivae and EOM are normal. Pupils are equal, round, and reactive to light.  Neck: Carotid bruit is not present.  Cardiovascular: Normal rate, regular rhythm, normal heart sounds and intact distal pulses.   Pulmonary/Chest: Effort normal and breath sounds normal.  Abdominal: Soft. She exhibits no pulsatile midline mass. There is no tenderness.  Neurological: She is alert and oriented to person, place, and time.  Skin: Skin is warm and dry.  New nail that is growing in below a lifted nail the proximal 1/4th of nail is new. It is partially attach to the medial aspect of the old nail.   Psychiatric: She has a normal mood and affect. Her behavior is normal.  Vitals reviewed.  Vitals:   09/25/15 1009  BP: 118/72  Pulse: 82  Resp: 16  Temp: 99 F (37.2 C)  TempSrc: Oral  SpO2: 99%  Weight: 144 lb (65.3 kg)  Height: 5\' 2"  (1.575 m)    Assessment & Plan:  Kelly Fitzpatrick is a 55 y.o. female Hyperlipidemia - Plan: COMPLETE METABOLIC PANEL WITH GFR, Lipid panel  -  Off statin recently as she thought low numbers before indicated less of a need for statin. She has also changed diet, and with weight loss will see what her new numbers are off of statin. If elevated, discussed need for statin with history of diabetes, so could restart Pravachol.  Type 2 diabetes mellitus without complication, without long-term current use of insulin (HCC) - Plan: Hemoglobin A1c  - A1c pending. Tolerating higher dose of metformin.  Onycholysis  - Appears to be new nail growing in underneath prvious nail wit some signs of onychomycosis. No signs of surrounding paronychia or infection. Nontender, no ingrown component.  Discussed covering nail to keep from getting caught on objects, but no other intervention needed at this time. RTC precautions if painful, or worsening.  No orders of the defined types were placed in this encounter.  Patient Instructions       IF you received an x-ray today, you will receive an invoice from [facility]. Please contact [facility] at [phone] with questions or concerns regarding your invoice.   IF you received labwork today, you will receive an invoice from [employer]. Please contact [facility] at [phone] with questions or concerns regarding your invoice.   Our billing staff will not be able to assist you with questions regarding bills from these companies.  You will be contacted with the lab results as soon as they are available. The fastest way to get your results is to activate your My Chart account. Instructions are located on the last page of this paperwork. If you have not heard from [place] regarding the results in 2 weeks, please contact this office.     We recommend that you schedule a mammogram for breast cancer screening. Typically, you do not need a referral to do this. Please contact a local imaging center to schedule your mammogram.  [facility] - [phone]  *ask for  the Radiology Department [facility]) - [phone] or [phone]  [facility] - [phone] [facility] - [phone] [facility] - [phone]  *ask for the Radiology Department [facility] - [phone]  *ask for the Radiology Department [facility] - [phone]  *ask for the Mammography Department [facility] - [phone]   Continue same dose of metformin for now, I will let you know when I see her blood sugar results.   I will check your cholesterol test today, but if those numbers are higher than last visit, would recommend restarting Pravachol.  Your toenail appears to have the new nail growing in from below. I do not see signs of infection or ingrown nail at this time. You can put a bandage over the nail if you need to, but make sure there is sufficient air to the nail in the evening.  If there is pain around the nail, or any discharge, return for recheck.     I personally performed the services described in this documentation, which was scribed in my presence. The recorded information has been reviewed and considered, and addended by me as needed.   Signed,   Meredith Staggers, MD [facility].  09/25/15 12:52 PM

## 2015-09-25 NOTE — Patient Instructions (Addendum)
     IF you received an x-ray today, you will receive an invoice from [facility]. Please contact [facility] at [phone] with questions or concerns regarding your invoice.   IF you received labwork today, you will receive an invoice from [employer]. Please contact [facility] at [phone] with questions or concerns regarding your invoice.   Our billing staff will not be able to assist you with questions regarding bills from these companies.  You will be contacted with the lab results as soon as they are available. The fastest way to get your results is to activate your My Chart account. Instructions are located on the last page of this paperwork. If you have not heard from [place] regarding the results in 2 weeks, please contact this office.     We recommend that you schedule a mammogram for breast cancer screening. Typically, you do not need a referral to do this. Please contact a local imaging center to schedule your mammogram.  [facility] - [phone]  *ask for the Radiology Department [facility]) - [phone] or [phone]  [facility] - [phone] [facility] - [phone] [facility] - [phone]  *ask for the Radiology Department [facility] - [phone]  *ask for the Radiology Department [facility] - [phone]  *ask for the Mammography Department [facility] - [phone]   Continue same dose of metformin for now, I will let you know when I see her blood sugar results.   I will check your cholesterol test today, but if those numbers are higher than last visit, would recommend restarting Pravachol.  Your toenail appears to have the new nail growing in from below. I do not see signs of infection or ingrown nail at this time. You can put a bandage over the nail if you need to, but make sure there is  sufficient air to the nail in the evening.  If there is pain around the nail, or any discharge, return for recheck.

## 2015-10-04 ENCOUNTER — Other Ambulatory Visit: Payer: Self-pay | Admitting: Family Medicine

## 2015-12-19 ENCOUNTER — Emergency Department (HOSPITAL_COMMUNITY): Payer: Federal, State, Local not specified - PPO

## 2015-12-19 ENCOUNTER — Encounter (HOSPITAL_COMMUNITY): Payer: Self-pay | Admitting: Emergency Medicine

## 2015-12-19 ENCOUNTER — Emergency Department (HOSPITAL_COMMUNITY)
Admission: EM | Admit: 2015-12-19 | Discharge: 2015-12-19 | Disposition: A | Discharge status: Home / Self Care | Payer: Federal, State, Local not specified - PPO | Attending: Emergency Medicine | Admitting: Emergency Medicine

## 2015-12-19 DIAGNOSIS — Z7984 Long term (current) use of oral hypoglycemic drugs: Secondary | ICD-10-CM | POA: Insufficient documentation

## 2015-12-19 DIAGNOSIS — R51 Headache: Secondary | ICD-10-CM | POA: Insufficient documentation

## 2015-12-19 DIAGNOSIS — E119 Type 2 diabetes mellitus without complications: Secondary | ICD-10-CM | POA: Insufficient documentation

## 2015-12-19 DIAGNOSIS — I10 Essential (primary) hypertension: Secondary | ICD-10-CM | POA: Insufficient documentation

## 2015-12-19 DIAGNOSIS — Y92410 Unspecified street and highway as the place of occurrence of the external cause: Secondary | ICD-10-CM | POA: Insufficient documentation

## 2015-12-19 DIAGNOSIS — Y999 Unspecified external cause status: Secondary | ICD-10-CM | POA: Insufficient documentation

## 2015-12-19 DIAGNOSIS — Y939 Activity, unspecified: Secondary | ICD-10-CM | POA: Diagnosis not present

## 2015-12-19 DIAGNOSIS — R519 Headache, unspecified: Secondary | ICD-10-CM

## 2015-12-19 MED ORDER — NAPROXEN 500 MG PO TABS: 500.0000 mg | ORAL_TABLET | Freq: Two times a day (BID) | ORAL | 0 refills | Status: DC

## 2015-12-19 NOTE — ED Notes (Signed)
Patient transported to CT 
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
## 2015-12-19 NOTE — ED Notes (Signed)
UP TO BATHROOM/

## 2015-12-19 NOTE — Discharge Instructions (Signed)
Your xrays are negative for any sign of broken bones. Apply ice to affected area. Increase your mobility. Take Naprosyn as needed for pain and inflammation. Follow up with your primary care provider if your symptoms do not improve. Return to the ED if you experience loss of consciousness, blurry vision, vomiting, loss of control of your bowel and bladder, numbness or tingling in your lower extremities. ° °

## 2015-12-19 NOTE — ED Notes (Signed)
RETURNED FROM CT
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
## 2016-02-07 ENCOUNTER — Other Ambulatory Visit: Payer: Self-pay | Admitting: Family Medicine

## 2016-02-07 DIAGNOSIS — E119 Type 2 diabetes mellitus without complications: Secondary | ICD-10-CM

## 2016-02-12 ENCOUNTER — Other Ambulatory Visit: Payer: Self-pay

## 2016-02-12 DIAGNOSIS — E119 Type 2 diabetes mellitus without complications: Secondary | ICD-10-CM

## 2016-02-12 MED ORDER — GLUCOSE BLOOD VI STRP: 1.0000 | ORAL_STRIP | Freq: Every day | 0 refills | Status: DC

## 2016-02-12 NOTE — Telephone Encounter (Signed)
fax req [facility] to give directions for use of lancets  Sent test blood glucose fingerstick once daily

## 2016-04-15 DIAGNOSIS — K080 Exfoliation of teeth due to systemic causes: Secondary | ICD-10-CM | POA: Diagnosis not present

## 2016-05-09 DIAGNOSIS — K080 Exfoliation of teeth due to systemic causes: Secondary | ICD-10-CM | POA: Diagnosis not present

## 2016-05-12 DIAGNOSIS — K080 Exfoliation of teeth due to systemic causes: Secondary | ICD-10-CM | POA: Diagnosis not present

## 2016-06-06 DIAGNOSIS — K080 Exfoliation of teeth due to systemic causes: Secondary | ICD-10-CM | POA: Diagnosis not present

## 2016-06-10 ENCOUNTER — Other Ambulatory Visit: Payer: Self-pay | Admitting: Family Medicine

## 2016-06-10 DIAGNOSIS — E119 Type 2 diabetes mellitus without complications: Secondary | ICD-10-CM

## 2016-06-13 DIAGNOSIS — K080 Exfoliation of teeth due to systemic causes: Secondary | ICD-10-CM | POA: Diagnosis not present

## 2016-06-21 ENCOUNTER — Encounter: Payer: Self-pay | Admitting: Physician Assistant

## 2016-06-21 ENCOUNTER — Ambulatory Visit (INDEPENDENT_AMBULATORY_CARE_PROVIDER_SITE_OTHER): Payer: Federal, State, Local not specified - PPO | Admitting: Physician Assistant

## 2016-06-21 ENCOUNTER — Ambulatory Visit (INDEPENDENT_AMBULATORY_CARE_PROVIDER_SITE_OTHER): Payer: Federal, State, Local not specified - PPO

## 2016-06-21 VITALS — BP 110/73 | HR 80 | Temp 98.5°F | Resp 17 | Ht 62.0 in | Wt 144.0 lb

## 2016-06-21 DIAGNOSIS — M79674 Pain in right toe(s): Secondary | ICD-10-CM | POA: Diagnosis not present

## 2016-06-21 LAB — POCT CBC
Granulocyte percent: 83 %G — AB (ref 37–80)
HCT, POC: 39.2 % (ref 37.7–47.9)
Hemoglobin: 13.2 g/dL (ref 12.2–16.2)
Lymph, poc: 0.6 (ref 0.6–3.4)
MCH, POC: 27.8 pg (ref 27–31.2)
MCHC: 33.7 g/dL (ref 31.8–35.4)
MCV: 82.4 fL (ref 80–97)
MID (cbc): 0.3 (ref 0–0.9)
MPV: 8.6 fL (ref 0–99.8)
POC Granulocyte: 4.4 (ref 2–6.9)
POC LYMPH PERCENT: 11.8 %L (ref 10–50)
POC MID %: 5.2 %M (ref 0–12)
Platelet Count, POC: 200 10*3/uL (ref 142–424)
RBC: 4.76 M/uL (ref 4.04–5.48)
RDW, POC: 14.3 %
WBC: 5.3 10*3/uL (ref 4.6–10.2)

## 2016-06-21 MED ORDER — MELOXICAM 7.5 MG PO TABS: 7.5000 mg | ORAL_TABLET | Freq: Every day | ORAL | 0 refills | Status: DC

## 2016-06-21 NOTE — Patient Instructions (Addendum)
Your plain films do not indicate a fracture of your toe and your history is not fully consistent for gout. This is likely an inflammatory process and therefore can be treated with antiinflammatories. I recommend taking motrin  every 8 hours with food for at least one week. Apply ice to the affected area 3-4 x a day for 20 minutes at a time. Also, keep the toe taped to the neighboring toe until pain and swelling resolve. Avoid tight fitting shoes until the pain resolves. Please return to clinic if symptoms worsen, do not improve in 7-10 days, or as needed    IF you received an x-ray today, you will receive an invoice from [facility]. Please contact [facility] at [phone] with questions or concerns regarding your invoice.   IF you received labwork today, you will receive an invoice from [place]. Please contact [facility] at [phone] with questions or concerns regarding your invoice.   Our billing staff will not be able to assist you with questions regarding bills from these companies.  You will be contacted with the lab results as soon as they are available. The fastest way to get your results is to activate your My Chart account. Instructions are located on the last page of this paperwork. If you have not heard from [place] regarding the results in 2 weeks, please contact this office.    We recommend that you schedule a mammogram for breast cancer screening. Typically, you do not need a referral to do this. Please contact a local imaging center to schedule your mammogram.  [facility] - [phone]  *ask for the Radiology Department [facility]) - [phone] or [phone]  [facility] - [phone] [facility] - [phone] [facility] - [phone]  *ask for the Radiology Department [facility] - [phone]  *ask for the Radiology Department [facility] -  [phone]  *ask for the Mammography Department [facility] - [phone]

## 2016-06-21 NOTE — Progress Notes (Signed)
Kelly Fitzpatrick  MRN: [id number] DOB: 02-02-1961  Subjective:  Kelly Fitzpatrick is a 56 y.o. female seen in office today for a chief complaint of right 2nd toe pain x 2 days. She experienced a cramping sensation in her right 2nd-4th toes while walking down the stairs yesterday and the pain in her 2nd toe lingered.  She then noticed swelling. The day prior to this she does admit to wearing a new pair of shoes that were uncomfortable. Denies redness, warmth, open sores, numbness, and tingling. Has not tried anything for relief. Pain is worsened with ambulation. No history of gout or fractures. She denies eating high purine foods and alcohol use.   Review of Systems  Constitutional: Negative for chills, diaphoresis and fever.    Patient Active Problem List   Diagnosis Date Noted  . Hyperlipidemia 06/20/2012  . Diabetes (HCC) 06/20/2012    Current Outpatient Prescriptions on File Prior to Visit  Medication Sig Dispense Refill  . cholecalciferol (VITAMIN D) 400 UNITS TABS Take 400 Units by mouth daily.    . Garlic 10 MG CAPS Take 1 tablet by mouth daily.    Marland Kitchen glucose blood (ONE TOUCH ULTRA TEST) test strip 1 each by Other route daily. Test blood glucose with fingerstick once daily 100 each 0  . KRILL OIL PO Take by mouth.    . Lancets MISC To check blood sugar bid dx code 250.00 100 each 2  . lisinopril (PRINIVIL,ZESTRIL) 5 MG tablet TAKE 1 TABLET (5 MG TOTAL) BY MOUTH DAILY. 30 tablet 0  . loratadine-pseudoephedrine (CLARITIN-D 12-HOUR) 5-120 MG per tablet Take 1 tablet by mouth 2 (two) times daily as needed. Reported on 06/29/2015    . metFORMIN (GLUCOPHAGE) 500 MG tablet Take 1 tablet (500 mg total) by mouth 2 (two) times daily with a meal. 180 tablet 3  . pravastatin (PRAVACHOL) 40 MG tablet Take 1 tablet (40 mg total) by mouth daily. 90 tablet 3  . calcium gluconate 500 MG tablet Take 500 mg by mouth daily. Reported on 06/29/2015     No current facility-administered medications on file  prior to visit.     Allergies  Allergen Reactions  . Cephalexin Other (See Comments)    unknown  . Amoxicillin Rash  . Hyoscyamine Sulfate Rash  . Sulfa Antibiotics Rash   Social History   Social History  . Marital status: Single    Spouse name: N/A  . Number of children: N/A  . Years of education: N/A   Occupational History  . Not on file.   Social History Main Topics  . Smoking status: Never Smoker  . Smokeless tobacco: Never Used  . Alcohol use No  . Drug use: No  . Sexual activity: Not on file   Other Topics Concern  . Not on file   Social History Narrative  . No narrative on file      Objective:  BP 110/73 (BP Location: Right Arm, Patient Position: Sitting, Cuff Size: Normal)   Pulse 80   Temp 98.5 F (36.9 C) (Oral)   Resp 17   Ht  (1.575 m)   Wt 144 lb (65.3 kg)   SpO2 98%   BMI 26.34 kg/m   Physical Exam  Constitutional: She is oriented to person, place, and time and well-developed, well-nourished, and in no distress.  HENT:  Head: Normocephalic and atraumatic.  Eyes: Conjunctivae are normal.  Neck: Normal range of motion.  Pulmonary/Chest: Effort normal.  Musculoskeletal:  Right foot: There is normal capillary refill.       Feet:  Neurological: She is alert and oriented to person, place, and time. Gait normal.  Skin: Skin is warm and dry.  Psychiatric: Affect normal.  Vitals reviewed.  Dg Toe 2nd Right  Result Date: 06/21/2016 CLINICAL DATA:  Acute onset of pain in the second toe walking down stairs yesterday. EXAM: RIGHT SECOND TOE COMPARISON:  None. FINDINGS: No abnormality of the second toe is identified. There are mild changes of bunion at the metatarsal phalangeal joint of the great toe. IMPRESSION: Second toe negative.  Bunion MTP joint of the great toe. Electronically Signed   By: Paulina Fusi M.D.   On: 06/21/2016 16:06   Results for orders placed or performed in visit on 06/21/16 (from the past 24 hour(s))  POCT CBC      Status: Abnormal   Collection Time: 06/21/16  4:43 PM  Result Value Ref Range   WBC 5.3 4.6 - 10.2 K/uL   Lymph, poc 0.6 0.6 - 3.4   POC LYMPH PERCENT 11.8 10 - 50 %L   MID (cbc) 0.3 0 - 0.9   POC MID % 5.2 0 - 12 %M   POC Granulocyte 4.4 2 - 6.9   Granulocyte percent 83.0 (A) 37 - 80 %G   RBC 4.76 4.04 - 5.48 M/uL   Hemoglobin 13.2 12.2 - 16.2 g/dL   HCT, POC 88.4 16.6 - 47.9 %   MCV 82.4 80 - 97 fL   MCH, POC 27.8 27 - 31.2 pg   MCHC 33.7 31.8 - 35.4 g/dL   RDW, POC 06.3 %   Platelet Count, POC 200 142 - 424 K/uL   MPV 8.6 0 - 99.8 fL    Assessment and Plan :  1. Toe pain, right Plain films and. POCT CBC reassuring. Hx and PE findings are not convincing for gout. Labs pending. Pt has refused treatment with prescription strength antiinflammatories but is willing to try OTC NSAIDs. Instructed to use 600-800mg  motrin every 8 hours daily for the next week. Apply ice to affected area. Return to clinic if symptoms worsen, do not improve in 7-10 days, or as needed - DG Toe 2nd Right; Future - POCT CBC - Uric Acid   Benjiman Core, PA-C  Primary Care at [facility] 06/21/2016 7:02 PM

## 2016-06-22 LAB — URIC ACID: Uric Acid: 4.6 mg/dL (ref 2.5–7.1)

## 2016-06-25 ENCOUNTER — Other Ambulatory Visit: Payer: Self-pay | Admitting: Family Medicine

## 2016-06-25 DIAGNOSIS — E119 Type 2 diabetes mellitus without complications: Secondary | ICD-10-CM

## 2016-07-04 DIAGNOSIS — K080 Exfoliation of teeth due to systemic causes: Secondary | ICD-10-CM | POA: Diagnosis not present

## 2016-07-07 ENCOUNTER — Other Ambulatory Visit: Payer: Self-pay | Admitting: Family Medicine

## 2016-07-07 DIAGNOSIS — E119 Type 2 diabetes mellitus without complications: Secondary | ICD-10-CM

## 2016-07-11 DIAGNOSIS — K080 Exfoliation of teeth due to systemic causes: Secondary | ICD-10-CM | POA: Diagnosis not present

## 2016-07-14 ENCOUNTER — Ambulatory Visit (INDEPENDENT_AMBULATORY_CARE_PROVIDER_SITE_OTHER): Payer: Federal, State, Local not specified - PPO | Admitting: Physician Assistant

## 2016-07-14 ENCOUNTER — Encounter: Payer: Self-pay | Admitting: Physician Assistant

## 2016-07-14 VITALS — BP 116/80 | HR 81 | Temp 98.0°F | Resp 16

## 2016-07-14 DIAGNOSIS — H00015 Hordeolum externum left lower eyelid: Secondary | ICD-10-CM | POA: Diagnosis not present

## 2016-07-14 DIAGNOSIS — H579 Unspecified disorder of eye and adnexa: Secondary | ICD-10-CM

## 2016-07-14 DIAGNOSIS — R938 Abnormal findings on diagnostic imaging of other specified body structures: Secondary | ICD-10-CM

## 2016-07-14 NOTE — Progress Notes (Signed)
Kelly Fitzpatrick  MRN: [id number] DOB: 10/15/1960  Subjective:  Kelly Fitzpatrick is a 56 y.o. female seen in office today for a chief complaint of stye in left eye x 2 days. Has associated mild itching, soreness, and purulent drainage on lower eyelid in the morning when she wakes up. Denies acute injury, blurred vision, double vision, redness, photophobia, and foreign body sensation. Denies use of contact lenses. Wears glasses. Has tried warm compresses with relief. Of note, pt has hx of styes. Last eye exam was 11/2015, which was normal.   Review of Systems  Constitutional: Negative for chills, diaphoresis and fever.  HENT: Negative for congestion and ear pain.       Patient Active Problem List   Diagnosis Date Noted  . Hyperlipidemia 06/20/2012  . Diabetes (HCC) 06/20/2012    Current Outpatient Prescriptions on File Prior to Visit  Medication Sig Dispense Refill  . cholecalciferol (VITAMIN D) 400 UNITS TABS Take 400 Units by mouth daily.    . Garlic 10 MG CAPS Take 1 tablet by mouth daily.    Marland Kitchen glucose blood (ONE TOUCH ULTRA TEST) test strip 1 each by Other route daily. Test blood glucose with fingerstick once daily 100 each 0  . KRILL OIL PO Take by mouth.    . Lancets MISC To check blood sugar bid dx code 250.00 100 each 2  . lisinopril (PRINIVIL,ZESTRIL) 5 MG tablet TAKE 1 TABLET BY MOUTH EVERY DAY 30 tablet 0  . loratadine-pseudoephedrine (CLARITIN-D 12-HOUR) 5-120 MG per tablet Take 1 tablet by mouth 2 (two) times daily as needed. Reported on 06/29/2015    . metFORMIN (GLUCOPHAGE) 500 MG tablet TAKE 1 TABLET (500 MG TOTAL) BY MOUTH 2 (TWO) TIMES DAILY WITH A MEAL. 60 tablet 0  . pravastatin (PRAVACHOL) 40 MG tablet Take 1 tablet (40 mg total) by mouth daily. 90 tablet 3  . calcium gluconate 500 MG tablet Take 500 mg by mouth daily. Reported on 06/29/2015     No current facility-administered medications on file prior to visit.     Allergies  Allergen Reactions  . Cephalexin  Other (See Comments)    unknown  . Amoxicillin Rash  . Hyoscyamine Sulfate Rash  . Sulfa Antibiotics Rash     Objective:  BP 116/80 (Cuff Size: Normal)   Pulse 81   Temp 98 F (36.7 C) (Oral)   Resp 16   SpO2 98%     Visual Acuity Screening   Right eye Left eye Both eyes  Without correction: 20/25 20/30 20/20   With correction:        Physical Exam  Constitutional: She is oriented to person, place, and time and well-developed, well-nourished, and in no distress.  HENT:  Head: Normocephalic and atraumatic.  Eyes: Left eye visual fields normal. Conjunctivae and EOM are normal. Pupils are equal, round, and reactive to light. Left eye exhibits discharge (mild purulent discharge noted when lower eyelid everted) and hordeolum (noted on medial aspect of lower eyelid ). Left conjunctiva is not injected.  Small pigmented lesion on superior lateral aspect of  left retina during fundoscopic exam.  No pain with palpation of bilateral orbital rims.   Neck: Normal range of motion.  Pulmonary/Chest: Effort normal.  Neurological: She is alert and oriented to person, place, and time. Gait normal.  Skin: Skin is warm and dry.  Psychiatric: Affect normal.  Vitals reviewed.    Assessment and Plan :  1. Fundoscopy abnormal Due to abnormal finding of pigmented  lesion on left retina, opthalmology referral placed.  - Ambulatory referral to Ophthalmology  2. Hordeolum externum of left lower eyelid Continue warm compress 4-5 x day for 10-15 minutes at a time. Also instructed to use Johnson's baby shampoo to wash affected eye. If no improvement in 2-3 days, return to clinic. If symptoms worsen, seek care sooner.   Benjiman CoreBrittany [place]ton, PA-C  Primary Care at [facility] 07/14/2016 6:58 PM

## 2016-07-14 NOTE — Patient Instructions (Addendum)
For the stye:  -Apply warm compress to the affected eye for 10-15 minutes 4-5 times during the day until symptoms improve.  -Avoid aggressively rubbing or cleansing of the eye.  -Use Johnson's baby shampoo to wash the affected area.  -If no improvement in 2-3 days, return for referral to opthalmologist. -If you develop fever, chills,or inability to move eye, seek medical care immediately.  For the lesion noted on fundoscopic exam, I have placed a referral for ophthalmology. You should hear from them in the next 1-2 weeks. Thank you for letting me participate in your health and well being.    IF you received an x-ray today, you will receive an invoice from [facility]. Please contact [facility] at [phone] with questions or concerns regarding your invoice.   IF you received labwork today, you will receive an invoice from [facility]. Please contact [facility] at [phone] with questions or concerns regarding your invoice.   Our billing staff will not be able to assist you with questions regarding bills from these companies.  You will be contacted with the lab results as soon as they are available. The fastest way to get your results is to activate your My Chart account. Instructions are located on the last page of this paperwork. If you have not heard from us regarding the results in 2 weeks, please contact this office.

## 2016-07-27 ENCOUNTER — Other Ambulatory Visit: Payer: Self-pay | Admitting: Family Medicine

## 2016-07-27 DIAGNOSIS — E119 Type 2 diabetes mellitus without complications: Secondary | ICD-10-CM

## 2016-08-06 ENCOUNTER — Ambulatory Visit: Payer: Federal, State, Local not specified - PPO | Admitting: Urgent Care

## 2016-08-08 ENCOUNTER — Ambulatory Visit (INDEPENDENT_AMBULATORY_CARE_PROVIDER_SITE_OTHER): Payer: Federal, State, Local not specified - PPO | Admitting: Physician Assistant

## 2016-08-08 ENCOUNTER — Encounter: Payer: Self-pay | Admitting: Physician Assistant

## 2016-08-08 VITALS — BP 126/80 | HR 92 | Temp 98.1°F | Resp 18 | Ht 61.42 in | Wt 138.6 lb

## 2016-08-08 DIAGNOSIS — N898 Other specified noninflammatory disorders of vagina: Secondary | ICD-10-CM | POA: Diagnosis not present

## 2016-08-08 DIAGNOSIS — Z114 Encounter for screening for human immunodeficiency virus [HIV]: Secondary | ICD-10-CM

## 2016-08-08 DIAGNOSIS — Z0000 Encounter for general adult medical examination without abnormal findings: Secondary | ICD-10-CM

## 2016-08-08 DIAGNOSIS — Z130 Encounter for screening for diseases of the blood and blood-forming organs and certain disorders involving the immune mechanism: Secondary | ICD-10-CM | POA: Diagnosis not present

## 2016-08-08 DIAGNOSIS — E119 Type 2 diabetes mellitus without complications: Secondary | ICD-10-CM

## 2016-08-08 DIAGNOSIS — R8299 Other abnormal findings in urine: Secondary | ICD-10-CM | POA: Diagnosis not present

## 2016-08-08 DIAGNOSIS — Z1329 Encounter for screening for other suspected endocrine disorder: Secondary | ICD-10-CM

## 2016-08-08 DIAGNOSIS — Z1239 Encounter for other screening for malignant neoplasm of breast: Secondary | ICD-10-CM

## 2016-08-08 DIAGNOSIS — Z23 Encounter for immunization: Secondary | ICD-10-CM | POA: Diagnosis not present

## 2016-08-08 DIAGNOSIS — E785 Hyperlipidemia, unspecified: Secondary | ICD-10-CM | POA: Diagnosis not present

## 2016-08-08 DIAGNOSIS — R82998 Other abnormal findings in urine: Secondary | ICD-10-CM

## 2016-08-08 DIAGNOSIS — Z1231 Encounter for screening mammogram for malignant neoplasm of breast: Secondary | ICD-10-CM | POA: Diagnosis not present

## 2016-08-08 LAB — POC MICROSCOPIC URINALYSIS (UMFC): Mucus: ABSENT

## 2016-08-08 LAB — POCT WET + KOH PREP
Trich by wet prep: ABSENT
Yeast by KOH: ABSENT
Yeast by wet prep: ABSENT

## 2016-08-08 LAB — POCT URINALYSIS DIP (MANUAL ENTRY)
Bilirubin, UA: NEGATIVE
Blood, UA: NEGATIVE
Glucose, UA: NEGATIVE mg/dL
Ketones, POC UA: NEGATIVE mg/dL
Nitrite, UA: NEGATIVE
Protein Ur, POC: NEGATIVE mg/dL
Spec Grav, UA: 1.025 (ref 1.010–1.025)
Urobilinogen, UA: 0.2 E.U./dL
pH, UA: 5.5 (ref 5.0–8.0)

## 2016-08-08 LAB — POCT GLYCOSYLATED HEMOGLOBIN (HGB A1C): Hemoglobin A1C: 6.5

## 2016-08-08 MED ORDER — PRAVASTATIN SODIUM 40 MG PO TABS: 40.0000 mg | ORAL_TABLET | Freq: Every day | ORAL | 3 refills | Status: DC

## 2016-08-08 MED ORDER — LISINOPRIL 5 MG PO TABS: 5.0000 mg | ORAL_TABLET | Freq: Every day | ORAL | 1 refills | Status: DC

## 2016-08-08 MED ORDER — METFORMIN HCL 500 MG PO TABS: 500.0000 mg | ORAL_TABLET | Freq: Two times a day (BID) | ORAL | 1 refills | Status: DC

## 2016-08-08 NOTE — Patient Instructions (Addendum)
For diabetes, keep up the good work!! I have placed a referral for diabetes nutrition and they should contact you in the next few weeks. Follow up with me in 6 months.   For vaginal discharge, I have sent off some other labs and will send a letter with the results.   For sciatica, I have given you some stretching info below.   For mammogram, there is contact info below for how you can schedule this.   Thank you for letting me participate in your health and well being.  We recommend that you schedule a mammogram for breast cancer screening. Typically, you do not need a referral to do this. Please contact a local imaging center to schedule your mammogram.  [facility] - [phone]  *ask for the Radiology Department [facility]) - [phone] or [phone]  [facility] - [phone] [facility] - [phone] [facility] - [phone]  *ask for the Radiology Department [facility] - [phone]  *ask for the Radiology Department [facility] - [phone]  *ask for the Mammography Department [facility] - [phone]   IF you received an x-ray today, you will receive an invoice from [facility]. Please contact [facility] at [phone] with questions or concerns regarding your invoice.   IF you received labwork today, you will receive an invoice from [facility]. Please contact [facility] at [phone] with questions or concerns regarding your invoice.   Our billing staff will not be able to assist you with questions regarding bills from these companies.  You will be contacted with the lab results as soon as they are available. The fastest way to get your results is to activate your My Chart account. Instructions are located on the last page of this paperwork. If you have not heard from [place] regarding the results in 2 weeks, please contact this office.     Health Maintenance for Postmenopausal Women Menopause is a normal process in which your reproductive ability comes to an end. This process happens gradually over a span of months to years, usually between the ages of 51 and 76. Menopause is complete when you have missed 12 consecutive menstrual periods. It is important to talk with your health care provider about some of the most common conditions that affect postmenopausal women, such as heart disease, cancer, and bone loss (osteoporosis). Adopting a healthy lifestyle and getting preventive care can help to promote your health and wellness. Those actions can also lower your chances of developing some of these common conditions. What should I know about menopause? During menopause, you may experience a number of symptoms, such as:  Moderate-to-severe hot flashes.  Night sweats.  Decrease in sex drive.  Mood swings.  Headaches.  Tiredness.  Irritability.  Memory problems.  Insomnia.  Choosing to treat or not to treat menopausal changes is an individual decision that you make with your health care provider. What should I know about hormone replacement therapy and supplements? Hormone therapy products are effective for treating symptoms that are associated with menopause, such as hot flashes and night sweats. Hormone replacement carries certain risks, especially as you become older. If you are thinking about using estrogen or estrogen with progestin treatments, discuss the benefits and risks with your health care provider. What should I know about heart disease and stroke? Heart disease, heart attack, and stroke become more likely as you age. This may be due, in part, to  the hormonal changes that your body experiences during menopause. These can affect how your body processes dietary fats, triglycerides, and cholesterol. Heart attack and stroke are both medical emergencies. There are many things that you can do to help prevent heart  disease and stroke:  Have your blood pressure checked at least every 1-2 years. High blood pressure causes heart disease and increases the risk of stroke.  If you are 48-38 years old, ask your health care provider if you should take aspirin to prevent a heart attack or a stroke.  Do not use any tobacco products, including cigarettes, chewing tobacco, or electronic cigarettes. If you need help quitting, ask your health care provider.  It is important to eat a healthy diet and maintain a healthy weight. ? Be sure to include plenty of vegetables, fruits, low-fat dairy products, and lean protein. ? Avoid eating foods that are high in solid fats, added sugars, or salt (sodium).  Get regular exercise. This is one of the most important things that you can do for your health. ? Try to exercise for at least 150 minutes each week. The type of exercise that you do should increase your heart rate and make you sweat. This is known as moderate-intensity exercise. ? Try to do strengthening exercises at least twice each week. Do these in addition to the moderate-intensity exercise.  Know your numbers.Ask your health care provider to check your cholesterol and your blood glucose. Continue to have your blood tested as directed by your health care provider.  What should I know about cancer screening? There are several types of cancer. Take the following steps to reduce your risk and to catch any cancer development as early as possible. Breast Cancer  Practice breast self-awareness. ? This means understanding how your breasts normally appear and feel. ? It also means doing regular breast self-exams. Let your health care provider know about any changes, no matter how small.  If you are 48 or older, have a clinician do a breast exam (clinical breast exam or CBE) every year. Depending on your age, family history, and medical history, it may be recommended that you also have a yearly breast X-ray  (mammogram).  If you have a family history of breast cancer, talk with your health care provider about genetic screening.  If you are at high risk for breast cancer, talk with your health care provider about having an MRI and a mammogram every year.  Breast cancer (BRCA) gene test is recommended for women who have family members with BRCA-related cancers. Results of the assessment will determine the need for genetic counseling and BRCA1 and for BRCA2 testing. BRCA-related cancers include these types: ? Breast. This occurs in males or females. ? Ovarian. ? Tubal. This may also be called fallopian tube cancer. ? Cancer of the abdominal or pelvic lining (peritoneal cancer). ? Prostate. ? Pancreatic.  Cervical, Uterine, and Ovarian Cancer Your health care provider may recommend that you be screened regularly for cancer of the pelvic organs. These include your ovaries, uterus, and vagina. This screening involves a pelvic exam, which includes checking for microscopic changes to the surface of your cervix (Pap test).  For women ages 21-65, health care providers may recommend a pelvic exam and a Pap test every three years. For women ages 74-65, they may recommend the Pap test and pelvic exam, combined with testing for human papilloma virus (HPV), every five years. Some types of HPV increase your risk of cervical cancer. Testing for HPV may  also be done on women of any age who have unclear Pap test results.  Other health care providers may not recommend any screening for nonpregnant women who are considered low risk for pelvic cancer and have no symptoms. Ask your health care provider if a screening pelvic exam is right for you.  If you have had past treatment for cervical cancer or a condition that could lead to cancer, you need Pap tests and screening for cancer for at least 20 years after your treatment. If Pap tests have been discontinued for you, your risk factors (such as having a new sexual  partner) need to be reassessed to determine if you should start having screenings again. Some women have medical problems that increase the chance of getting cervical cancer. In these cases, your health care provider may recommend that you have screening and Pap tests more often.  If you have a family history of uterine cancer or ovarian cancer, talk with your health care provider about genetic screening.  If you have vaginal bleeding after reaching menopause, tell your health care provider.  There are currently no reliable tests available to screen for ovarian cancer.  Lung Cancer Lung cancer screening is recommended for adults 68-6 years old who are at high risk for lung cancer because of a history of smoking. A yearly low-dose CT scan of the lungs is recommended if you:  Currently smoke.  Have a history of at least 30 pack-years of smoking and you currently smoke or have quit within the past 15 years. A pack-year is smoking an average of one pack of cigarettes per day for one year.  Yearly screening should:  Continue until it has been 15 years since you quit.  Stop if you develop a health problem that would prevent you from having lung cancer treatment.  Colorectal Cancer  This type of cancer can be detected and can often be prevented.  Routine colorectal cancer screening usually begins at age 71 and continues through age 5.  If you have risk factors for colon cancer, your health care provider may recommend that you be screened at an earlier age.  If you have a family history of colorectal cancer, talk with your health care provider about genetic screening.  Your health care provider may also recommend using home test kits to check for hidden blood in your stool.  A small camera at the end of a tube can be used to examine your colon directly (sigmoidoscopy or colonoscopy). This is done to check for the earliest forms of colorectal cancer.  Direct examination of the colon  should be repeated every 5-10 years until age 62. However, if early forms of precancerous polyps or small growths are found or if you have a family history or genetic risk for colorectal cancer, you may need to be screened more often.  Skin Cancer  Check your skin from head to toe regularly.  Monitor any moles. Be sure to tell your health care provider: ? About any new moles or changes in moles, especially if there is a change in a mole's shape or color. ? If you have a mole that is larger than the size of a pencil eraser.  If any of your family members has a history of skin cancer, especially at a young age, talk with your health care provider about genetic screening.  Always use sunscreen. Apply sunscreen liberally and repeatedly throughout the day.  Whenever you are outside, protect yourself by wearing long sleeves, pants, a  wide-brimmed hat, and sunglasses.  What should I know about osteoporosis? Osteoporosis is a condition in which bone destruction happens more quickly than new bone creation. After menopause, you may be at an increased risk for osteoporosis. To help prevent osteoporosis or the bone fractures that can happen because of osteoporosis, the following is recommended:  If you are 39-25 years old, get at least 1,000 mg of calcium and at least 600 mg of vitamin D per day.  If you are older than age 70 but younger than age 33, get at least 1,200 mg of calcium and at least 600 mg of vitamin D per day.  If you are older than age 78, get at least 1,200 mg of calcium and at least 800 mg of vitamin D per day.  Smoking and excessive alcohol intake increase the risk of osteoporosis. Eat foods that are rich in calcium and vitamin D, and do weight-bearing exercises several times each week as directed by your health care provider. What should I know about how menopause affects my mental health? Depression may occur at any age, but it is more common as you become older. Common symptoms of  depression include:  Low or sad mood.  Changes in sleep patterns.  Changes in appetite or eating patterns.  Feeling an overall lack of motivation or enjoyment of activities that you previously enjoyed.  Frequent crying spells.  Talk with your health care provider if you think that you are experiencing depression. What should I know about immunizations? It is important that you get and maintain your immunizations. These include:  Tetanus, diphtheria, and pertussis (Tdap) booster vaccine.  Influenza every year before the flu season begins.  Pneumonia vaccine.  Shingles vaccine.  Your health care provider may also recommend other immunizations. This information is not intended to replace advice given to you by your health care provider. Make sure you discuss any questions you have with your health care provider. Document Released: 04/01/2005 Document Revised: 08/28/2015 Document Reviewed: 11/11/2014 Elsevier Interactive Patient Education  2018 [employer].    Spondylolisthesis Rehab Ask your health care provider which exercises are safe for you. Do exercises exactly as told by your health care provider and adjust them as directed. It is normal to feel mild stretching, pulling, tightness, or discomfort as you do these exercises, but you should stop right away if you feel sudden pain or your pain gets worse. Do not begin these exercises until told by your health care provider. Stretching and range of motion exercises These exercises warm up your muscles and joints and improve the movement and flexibility of your hips and your back. These exercises may also help to relieve pain, numbness, and tingling. Exercise A: Single knee to chest  1. Lie on your back on a firm surface with both legs straight. 2. Bend one of your knees. Use your hands to move your knee up toward your chest until you feel a gentle stretch in your lower back and buttock. ? Hold your leg in this position by  holding onto the front of your knee. ? Keep your other leg as straight as possible. 3. Hold for __________ seconds. 4. Slowly return to the starting position. 5. Repeat this exercise with your other leg. Repeat __________ times. Complete this exercise __________ times a day. Exercise B: Double knee to chest  1. Lie on your back on a firm surface with both legs straight. 2. Bend one of your knees and move it toward your chest until you  feel a gentle stretch in your lower back and buttock. 3. Tense your abdominal muscles and repeat the previous step with your other leg. 4. Hold both of your legs in this position by holding onto the backs of your thighs or the fronts of your knees. 5. Hold for __________ seconds. 6. Tense your abdominal muscles and slowly move your legs back to the floor, one leg at a time. Repeat __________ times. Complete this exercise __________ times a day. Strengthening exercises These exercises build strength and endurance in your back. Endurance is the ability to use your muscles for a long time, even after they get tired. Exercise C: Pelvic tilt 1. Lie on your back on a firm bed or the floor. Bend your knees and keep your feet flat. 2. Tense your abdominal muscles. Tip your pelvis up toward the ceiling and flatten your lower back into the floor. ? To help with this exercise, you may place a small towel under your lower back and try to push your back into the towel. 3. Hold for __________ seconds. 4. Let your muscles relax completely before you repeat this exercise. Repeat __________ times. Complete this exercise __________ times a day. Exercise D: Abdominal crunch  1. Lie on your back on a firm surface. Bend your knees and keep your feet flat. Cross your arms over your chest. 2. Tuck your chin down toward your chest, without bending your neck. 3. Use your abdominal muscles to lift your upper body off of the ground, straight up into the air. ? Try to lift yourself  until your shoulder blades are off the ground. You may need to work up to this. ? Keep your lower back on the ground while you crunch upward. ? Do not hold your breath. 4. Slowly lower yourself down. Keep your abdominal muscles tense until you are back to the starting position. Repeat __________ times. Complete this exercise __________ times a day. Exercise E: Alternating arm and leg raises  1. Get on your hands and knees on a firm surface. If you are on a hard floor, you may want to use padding to cushion your knees, such as an exercise mat. 2. Line up your arms and legs. Your hands should be below your shoulders, and your knees should be below your hips. 3. Lift your left leg behind you. At the same time, raise your right arm and straighten it in front of you. ? Do not lift your leg higher than your hip. ? Do not lift your arm higher than your shoulder. ? Keep your abdominal and back muscles tight. ? Keep your hips facing the ground. ? Do not arch your back. ? Keep your balance carefully, and do not hold your breath. 4. Hold for __________ seconds. 5. Slowly return to the starting position and repeat with your right leg and your left arm. Repeat __________ times. Complete this exercise __________ times a day. Posture and body mechanics  Body mechanics refers to the movements and positions of your body while you do your daily activities. Posture is part of body mechanics. Good posture and healthy body mechanics can help to relieve stress in your body's tissues and joints. Good posture means that your spine is in its natural S-curve position (your spine is neutral), your shoulders are pulled back slightly, and your head is not tipped forward. The following are general guidelines for applying improved posture and body mechanics to your everyday activities. Standing   When standing, keep your spine neutral and  your feet about hip-width apart. Keep a slight bend in your knees. Your ears,  shoulders, and hips should line up.  When you do a task in which you stand in one place for a long time, place one foot up on a stable object that is 2-4 inches (5-10 cm) high, such as a footstool. This helps keep your spine neutral. Sitting   When sitting, keep your spine neutral and keep your feet flat on the floor. Use a footrest, if necessary, and keep your thighs parallel to the floor. Avoid rounding your shoulders, and avoid tilting your head forward.  When working at a desk or a computer, keep your desk at a height where your hands are slightly lower than your elbows. Slide your chair under your desk so you are close enough to maintain good posture.  When working at a computer, place your monitor at a height where you are looking straight ahead and you do not have to tilt your head forward or downward to look at the screen. Resting  When lying down and resting, avoid positions that are most painful for you.  If you have pain with activities such as sitting, bending, stooping, or squatting (flexion-based activities), lie in a position in which your body does not bend very much. For example, avoid curling up on your side with your arms and knees near your chest (fetal position).  If you have pain with activities such as standing for a long time or reaching with your arms (extension-based activities), lie with your spine in a neutral position and bend your knees slightly. Try the following positions: ? Lying on your side with a pillow between your knees. ? Lying on your back with a pillow under your knees.  Lifting   When lifting objects, keep your feet at least shoulder-width apart and tighten your abdominal muscles.  Bend your knees and hips and keep your spine neutral. It is important to lift using the strength of your legs, not your back. Do not lock your knees straight out.  Always ask for help to lift heavy or awkward objects. This information is not intended to replace advice  given to you by your health care provider. Make sure you discuss any questions you have with your health care provider. Document Released: 02/07/2005 Document Revised: 10/15/2015 Document Reviewed: 11/18/2014 Elsevier Interactive Patient Education  [employer].

## 2016-08-08 NOTE — Progress Notes (Signed)
Kelly Fitzpatrick  MRN: [id number] DOB: 1960/04/12  Subjective:  Pt is a 56 y.o. female who presents for annual physical exam. Pt is fasting today.   Social: She is from [place], [place]. Lived in [place] for 29 years. Works at [employer] as Engineer, production. Works night shift. Single, no children. Not currently sexually active.   Diet: Does eat out a lot but will eat salads and veggies.  Eats yogurt, cheese, milk, and ice cream. Stopped drinking sodas a while ago, trying to drink more water. Takes vit d supplement.  Exercise: Walks 3 x a week.   Sleep: Gets at least 8 hours a day. Works night shift so sleeps during the day.   Pt had hysterectomy >15 years ago.   Last dental exam: 2018 Last vision exam: 2017, normal. Has an appointment with opthalmology on 08/12/16 for incidental finding of small pigmented lesion on superior lateral aspect of  left retina during fundoscopic exam seen on exam on 07/14/16.  Last pap smear: >3 years ago, had complete hysterectomy Last mammogram:  2012 Last colonoscopy: 2012 Vaccinations      Tetanus: >10 years ago        Chronic Conditions:  1) T2DM: Has had diagnosis since 2012. Controlled on 558m BID. She checks her sugars occasionally. Ranges between 110-160. Last A1C was 7.0 ten months ago. No known diabetes complications. Last diabetic eye exam was 12/06/15 and it was normal.  2) Hyperlipidemia: Controlled on pravachol 457m   Patient Active Problem List   Diagnosis Date Noted  . Hyperlipidemia 06/20/2012  . Diabetes ([place]    Current Outpatient Prescriptions on File Prior to Visit  Medication Sig Dispense Refill  . cholecalciferol (VITAMIN D) 400 UNITS TABS Take 400 Units by mouth daily.    . Garlic 10 MG CAPS Take 1 tablet by mouth daily.    . Marland Kitchenlucose blood (ONE TOUCH ULTRA TEST) test strip 1 each by Other route daily. Test blood glucose with fingerstick once daily 100 each 0  . KRILL OIL PO Take by mouth.    . Lancets  MISC To check blood sugar bid dx code 250.00 100 each 2  . loratadine-pseudoephedrine (CLARITIN-D 12-HOUR) 5-120 MG per tablet Take 1 tablet by mouth 2 (two) times daily as needed. Reported on 06/29/2015    . calcium gluconate 500 MG tablet Take 500 mg by mouth daily. Reported on 06/29/2015     No current facility-administered medications on file prior to visit.     Allergies  Allergen Reactions  . Cephalexin Other (See Comments)    unknown  . Amoxicillin Rash  . Hyoscyamine Sulfate Rash  . Sulfa Antibiotics Rash    Social History   Social History  . Marital status: Single    Spouse name: N/A  . Number of children: N/A  . Years of education: N/A   Social History Main Topics  . Smoking status: Never Smoker  . Smokeless tobacco: Never Used  . Alcohol use No  . Drug use: No  . Sexual activity: No   Other Topics Concern  . None   Social History Narrative   [place] Works at [employer] diEngineer, productionSingle, no children. Not currently sexually active.     Past Surgical History:  Procedure Laterality Date  . ABDOMINAL HYSTERECTOMY    . COLON SURGERY    . COLOSTOMY    . COLOSTOMY CLOSURE  Family History  Problem Relation Age of Onset  . Hypertension Mother   . Cancer Mother        breast cancer  . Cancer Father        lung cancer  . Multiple sclerosis Brother   . Heart disease Brother        heart transplant    Review of Systems  Constitutional: Negative for activity change, appetite change, chills, diaphoresis, fatigue, fever and unexpected weight change.  HENT: Negative for congestion, dental problem, drooling, ear discharge, ear pain, facial swelling, hearing loss, mouth sores, nosebleeds, postnasal drip, rhinorrhea, sinus pain, sinus pressure, sneezing, sore throat, tinnitus, trouble swallowing and voice change.   Eyes: Negative for photophobia, pain, discharge, redness, itching and visual  disturbance.  Respiratory: Negative for apnea, cough, choking, chest tightness, shortness of breath, wheezing and stridor.   Cardiovascular: Negative for chest pain, palpitations and leg swelling.  Gastrointestinal: Negative for abdominal distention, abdominal pain, anal bleeding, blood in stool, constipation, diarrhea, nausea, rectal pain and vomiting.  Endocrine: Negative for cold intolerance, heat intolerance, polydipsia, polyphagia and polyuria.  Genitourinary: Positive for vaginal discharge (x the past few weeks, has had issues with this in the past, states it comes and goes, notes has had bacteria on wet prep in the past and been told it was normal for her, no other GU symptoms ). Negative for decreased urine volume, difficulty urinating, dyspareunia, dysuria, enuresis, flank pain, frequency, genital sores, hematuria, menstrual problem, pelvic pain, urgency, vaginal bleeding and vaginal pain.  Musculoskeletal: Negative for arthralgias, back pain, gait problem, joint swelling, myalgias, neck pain and neck stiffness.  Skin: Negative for color change, pallor, rash and wound.  Allergic/Immunologic: Negative for environmental allergies, food allergies and immunocompromised state.  Neurological: Negative for dizziness, tremors, seizures, syncope, facial asymmetry, speech difficulty, weakness, light-headedness, numbness and headaches.  Hematological: Negative for adenopathy. Does not bruise/bleed easily.  Psychiatric/Behavioral: Negative for agitation, behavioral problems, confusion, decreased concentration, dysphoric mood, hallucinations, self-injury, sleep disturbance and suicidal ideas. The patient is not nervous/anxious and is not hyperactive.     Objective:  BP 126/80 (BP Location: Right Arm, Patient Position: Sitting, Cuff Size: Normal)   Pulse 92   Temp 98.1 F (36.7 C) (Oral)   Resp 18   Ht 5' 1.42" (1.56 m)   Wt 138 lb 9.6 oz (62.9 kg)   SpO2 99%   BMI 25.83 kg/m   Physical Exam    Constitutional: She is oriented to person, place, and time and well-developed, well-nourished, and in no distress.  HENT:  Head: Normocephalic and atraumatic.  Right Ear: Hearing, tympanic membrane, external ear and ear canal normal.  Left Ear: Hearing, tympanic membrane, external ear and ear canal normal.  Nose: Nose normal.  Mouth/Throat: Uvula is midline, oropharynx is clear and moist and mucous membranes are normal. No oropharyngeal exudate.  Eyes: Conjunctivae, EOM and lids are normal. Pupils are equal, round, and reactive to light. No scleral icterus.  Neck: Trachea normal and normal range of motion. No thyroid mass and no thyromegaly present.  Cardiovascular: Normal rate, regular rhythm, normal heart sounds and intact distal pulses.   Pulmonary/Chest: Effort normal and breath sounds normal. Right breast exhibits no inverted nipple, no mass, no nipple discharge, no skin change and no tenderness. Left breast exhibits no inverted nipple, no mass, no nipple discharge, no skin change and no tenderness. Breasts are symmetrical.  Abdominal: Soft. Normal appearance and bowel sounds are normal. There is no tenderness.  Genitourinary: Vulva normal.  Thick  creamy  yellow and vaginal discharge found.  Lymphadenopathy:       Head (right side): No tonsillar, no preauricular, no posterior auricular and no occipital adenopathy present.       Head (left side): No tonsillar, no preauricular, no posterior auricular and no occipital adenopathy present.    She has no cervical adenopathy.       Right: No supraclavicular adenopathy present.       Left: No supraclavicular adenopathy present.  Neurological: She is alert and oriented to person, place, and time. She has normal sensation, normal strength and normal reflexes. Gait normal.  Skin: Skin is warm and dry.  Psychiatric: Affect normal.    Visual Acuity Screening   Right eye Left eye Both eyes  Without correction:     With correction: 20/30 20/30  20/30    Results for orders placed or performed in visit on 08/08/16 (from the past 24 hour(s))  POCT glycosylated hemoglobin (Hb A1C)     Status: None   Collection Time: 08/08/16  3:08 PM  Result Value Ref Range   Hemoglobin A1C 6.5   POCT Wet + KOH Prep     Status: Abnormal   Collection Time: 08/08/16  3:09 PM  Result Value Ref Range   Yeast by KOH Absent Absent   Yeast by wet prep Absent Absent   WBC by wet prep Many (A) Few   Clue Cells Wet Prep HPF POC None None   Trich by wet prep Absent Absent   Bacteria Wet Prep HPF POC Many (A) Few   Epithelial Cells By [employer] ([facility]) Few None, Few, Too numerous to count   RBC,UR,HPF,POC None None RBC/hpf  POCT urinalysis dipstick     Status: Abnormal   Collection Time: 08/08/16  3:10 PM  Result Value Ref Range   Color, UA yellow yellow   Clarity, UA clear clear   Glucose, UA negative negative mg/dL   Bilirubin, UA negative negative   Ketones, POC UA negative negative mg/dL   Spec Grav, UA 1.025 1.010 - 1.025   Blood, UA negative negative   pH, UA 5.5 5.0 - 8.0   Protein Ur, POC negative negative mg/dL   Urobilinogen, UA 0.2 0.2 or 1.0 E.U./dL   Nitrite, UA Negative Negative   Leukocytes, UA Small (1+) (A) Negative  POCT Microscopic Urinalysis ([facility])     Status: Abnormal   Collection Time: 08/08/16  3:42 PM  Result Value Ref Range   WBC,UR,HPF,POC Many (A) None WBC/hpf   RBC,UR,HPF,POC None None RBC/hpf   Bacteria Many (A) None, Too numerous to count   Mucus Absent Absent   Epithelial Cells, UR Per Microscopy Few (A) None, Too numerous to count cells/hpf    Assessment and Plan :  Discussed healthy lifestyle, diet, exercise, preventative care, vaccinations, and addressed patient's concerns. Plan for follow up in 6 months. Otherwise, plan for specific conditions below.  1. Annual physical exam Pt prefers lab results to be sent via mail.   2. Screening, anemia, deficiency, iron - CBC with Differential/Platelet  3. Screening  for HIV (human immunodeficiency virus) - HIV antibody  4. Screening for thyroid disorder - TSH  5. Screening for breast cancer Contact info to schedule mammogram has been given to pt in AV[facility].   6. Need for Td vaccine - Td vaccine greater than or equal to 7yo preservative free IM  7. Type 2 diabetes mellitus without complication, without long-term current use of insulin (HCC) Well controlled.  A1C 6.5. Continue metformin as prescribed. Pt requests diabetic nutrition counseling as her goal is to come off of metformin with dietary changes. Follow up in 6 months ofr reevaluation.  - HM Diabetes Foot Exam - CMP14+EGFR - Microalbumin/Creatinine Ratio, Urine - Pneumococcal polysaccharide vaccine 23-valent greater than or equal to 2yo subcutaneous/IM - POCT glycosylated hemoglobin (Hb A1C) - metFORMIN (GLUCOPHAGE) 500 MG tablet; Take 1 tablet (500 mg total) by mouth 2 (two) times daily with a meal.  Dispense: 180 tablet; Refill: 1 - lisinopril (PRINIVIL,ZESTRIL) 5 MG tablet; Take 1 tablet (5 mg total) by mouth daily.  Dispense: 90 tablet; Refill: 1 - Ambulatory referral to diabetic education  8. Hyperlipidemia, unspecified hyperlipidemia type - Lipid panel - pravastatin (PRAVACHOL) 40 MG tablet; Take 1 tablet (40 mg total) by mouth daily.  Dispense: 90 tablet; Refill: 3  9. Vaginal discharge Labs pending. May be physiologically normal for patient.  - POCT Wet + KOH Prep - POCT urinalysis dipstick - POCT Microscopic Urinalysis ([facility]) - GC/Chlamydia Probe Amp  10. Leukocytes in urine Labs pending.  - Urine Culture   Tenna Delaine PA-C  [facility] 08/08/2016 4:41 PM

## 2016-08-09 LAB — CBC WITH DIFFERENTIAL/PLATELET
Basophils Absolute: 0 10*3/uL (ref 0.0–0.2)
Basos: 0 %
EOS (ABSOLUTE): 0 10*3/uL (ref 0.0–0.4)
Eos: 0 %
Hematocrit: 40 % (ref 34.0–46.6)
Hemoglobin: 13.1 g/dL (ref 11.1–15.9)
Immature Grans (Abs): 0 10*3/uL (ref 0.0–0.1)
Immature Granulocytes: 0 %
Lymphocytes Absolute: 0.7 10*3/uL (ref 0.7–3.1)
Lymphs: 13 %
MCH: 26.8 pg (ref 26.6–33.0)
MCHC: 32.8 g/dL (ref 31.5–35.7)
MCV: 82 fL (ref 79–97)
Monocytes Absolute: 0.4 10*3/uL (ref 0.1–0.9)
Monocytes: 8 %
Neutrophils Absolute: 4.1 10*3/uL (ref 1.4–7.0)
Neutrophils: 79 %
Platelets: 228 10*3/uL (ref 150–379)
RBC: 4.88 x10E6/uL (ref 3.77–5.28)
RDW: 15.5 % — ABNORMAL HIGH (ref 12.3–15.4)
WBC: 5.3 10*3/uL (ref 3.4–10.8)

## 2016-08-09 LAB — CMP14+EGFR
ALT: 7 IU/L (ref 0–32)
AST: 14 IU/L (ref 0–40)
Albumin/Globulin Ratio: 1.7 (ref 1.2–2.2)
Albumin: 4.6 g/dL (ref 3.5–5.5)
Alkaline Phosphatase: 67 IU/L (ref 39–117)
BUN/Creatinine Ratio: 21 (ref 9–23)
BUN: 16 mg/dL (ref 6–24)
Bilirubin Total: 0.3 mg/dL (ref 0.0–1.2)
CO2: 21 mmol/L (ref 20–29)
Calcium: 10 mg/dL (ref 8.7–10.2)
Chloride: 102 mmol/L (ref 96–106)
Creatinine, Ser: 0.77 mg/dL (ref 0.57–1.00)
GFR calc Af Amer: 98 mL/min/{1.73_m2} (ref >59)
GFR calc non Af Amer: 85 mL/min/{1.73_m2} (ref >59)
Globulin, Total: 2.7 g/dL (ref 1.5–4.5)
Glucose: 155 mg/dL — ABNORMAL HIGH (ref 65–99)
Potassium: 4.3 mmol/L (ref 3.5–5.2)
Sodium: 139 mmol/L (ref 134–144)
Total Protein: 7.3 g/dL (ref 6.0–8.5)

## 2016-08-09 LAB — HEMOGLOBIN A1C
Est. average glucose Bld gHb Est-mCnc: 131 mg/dL
Hgb A1c MFr Bld: 6.2 % — ABNORMAL HIGH (ref 4.8–5.6)

## 2016-08-09 LAB — LIPID PANEL
Chol/HDL Ratio: 2.7 ratio (ref 0.0–4.4)
Cholesterol, Total: 156 mg/dL (ref 100–199)
HDL: 57 mg/dL (ref >39)
LDL Calculated: 89 mg/dL (ref 0–99)
Triglycerides: 48 mg/dL (ref 0–149)
VLDL Cholesterol Cal: 10 mg/dL (ref 5–40)

## 2016-08-09 LAB — TSH: TSH: 2.76 u[IU]/mL (ref 0.450–4.500)

## 2016-08-09 LAB — MICROALBUMIN / CREATININE URINE RATIO
Creatinine, Urine: 129.2 mg/dL
Microalb/Creat Ratio: 23.7 mg/g creat (ref 0.0–30.0)
Microalbumin, Urine: 30.6 ug/mL

## 2016-08-09 LAB — GC/CHLAMYDIA PROBE AMP
Chlamydia trachomatis, NAA: NEGATIVE
Neisseria gonorrhoeae by PCR: NEGATIVE

## 2016-08-09 LAB — HIV ANTIBODY (ROUTINE TESTING W REFLEX): HIV Screen 4th Generation wRfx: NONREACTIVE

## 2016-08-12 DIAGNOSIS — H01021 Squamous blepharitis right upper eyelid: Secondary | ICD-10-CM | POA: Diagnosis not present

## 2016-08-12 DIAGNOSIS — E119 Type 2 diabetes mellitus without complications: Secondary | ICD-10-CM | POA: Diagnosis not present

## 2016-08-12 DIAGNOSIS — H01022 Squamous blepharitis right lower eyelid: Secondary | ICD-10-CM | POA: Diagnosis not present

## 2016-08-12 DIAGNOSIS — H25813 Combined forms of age-related cataract, bilateral: Secondary | ICD-10-CM | POA: Diagnosis not present

## 2016-08-12 LAB — URINE CULTURE

## 2016-08-13 ENCOUNTER — Other Ambulatory Visit: Payer: Self-pay | Admitting: Family Medicine

## 2016-08-13 DIAGNOSIS — E119 Type 2 diabetes mellitus without complications: Secondary | ICD-10-CM

## 2016-08-16 NOTE — Telephone Encounter (Signed)
Given 30 Tabs 6/18. Return visit for 6 mo No mention in note about BP med. Please advise

## 2016-08-17 NOTE — Telephone Encounter (Signed)
According to my note from 08/08/16, I gave 6 months worth of Rx for kidney protection as pt has T2DM. Please check with the pharmacy. Thanks!    - lisinopril (PRINIVIL,ZESTRIL) 5 MG tablet; Take 1 tablet (5 mg total) by mouth daily.  Dispense: 90 tablet; Refill: 1

## 2016-08-18 ENCOUNTER — Other Ambulatory Visit: Payer: Self-pay | Admitting: Physician Assistant

## 2016-08-18 MED ORDER — NITROFURANTOIN MONOHYD MACRO 100 MG PO CAPS: 100.0000 mg | ORAL_CAPSULE | Freq: Two times a day (BID) | ORAL | 0 refills | Status: AC

## 2016-08-18 NOTE — Progress Notes (Signed)
Meds ordered this encounter  Medications  . nitrofurantoin, macrocrystal-monohydrate, (MACROBID) 100 MG capsule    Sig: Take 1 capsule (100 mg total) by mouth 2 (two) times daily.    Dispense:  10 capsule    Refill:  0    Order Specific Question:   Supervising Provider    Answer:   SMITH, KRISTI M [[id number]]    

## 2016-08-23 ENCOUNTER — Telehealth: Payer: Self-pay | Admitting: Urgent Care

## 2016-08-23 NOTE — Telephone Encounter (Signed)
Patient needs FMLA forms completed for taking care of her mother. Patient is still working currently but is thinking she will need to take some time off to tend to her mother as she gets sicker. I was not sure how to complete the forms or if the patient needed to come in to complete them. I will place the blank form in Mani's box on 08/23/16 if you could let me know if you can complete them without seeing her or not. If so please return them to the FMLA/Disability box at the 102 checkout desk within 5-7 business days. Thank you!

## 2016-08-30 NOTE — Telephone Encounter (Signed)
Called patient and left a message for her to call us back and [facility]

## 2016-08-30 NOTE — Telephone Encounter (Signed)
I'll be happy to help with this. I am not sure where her mother is along treatment and how much time she will need. Please have patient set up a follow up visit with me about her mother. It would also be good for me to see her mother Franchot MimesLouise Cozad as well.

## 2016-09-01 NOTE — Telephone Encounter (Signed)
Paperwork scanned and given to the patient on 09/01/16

## 2016-09-04 ENCOUNTER — Other Ambulatory Visit: Payer: Self-pay | Admitting: Family Medicine

## 2016-09-04 DIAGNOSIS — E785 Hyperlipidemia, unspecified: Secondary | ICD-10-CM

## 2016-09-06 NOTE — Telephone Encounter (Signed)
Please call pharmacy and confirm that patient needs this medication because it appears that I refilled it on 08/08/16 for one year. Thanks!

## 2016-09-21 ENCOUNTER — Other Ambulatory Visit: Payer: Self-pay | Admitting: Family Medicine

## 2016-09-21 DIAGNOSIS — E119 Type 2 diabetes mellitus without complications: Secondary | ICD-10-CM

## 2016-09-26 ENCOUNTER — Telehealth: Payer: Self-pay | Admitting: Dietician

## 2016-09-26 NOTE — Telephone Encounter (Signed)
Reminder call for appointment at [facility] DM management [facility] at 3:30 09/27/16. Oran ReinLaura Payden Bonus, RD

## 2016-09-27 ENCOUNTER — Encounter: Payer: Federal, State, Local not specified - PPO | Attending: Physician Assistant | Admitting: *Deleted

## 2016-09-27 DIAGNOSIS — E119 Type 2 diabetes mellitus without complications: Secondary | ICD-10-CM | POA: Diagnosis not present

## 2016-09-27 DIAGNOSIS — Z713 Dietary counseling and surveillance: Secondary | ICD-10-CM | POA: Diagnosis not present

## 2016-09-27 NOTE — Patient Instructions (Signed)
Plan:  Aim for 2-3 Carb Choices per meal (30-45 grams)   Aim for 0-1 Carbs per snack if hungry  Include protein in moderation with your meals and snacks Consider reading food labels for Total Carbohydrate of foods When you are able, consider  increasing your activity level daily as tolerated Consider looking into Arm Chair Exercise videos on the computer as a back up plan for increasing your activity level. Consider checking BG at alternate times per day  Continue taking medication as directed by MD

## 2016-09-27 NOTE — Progress Notes (Signed)
Diabetes Self-Management Education  Visit Type: First/Initial  Appt. Start Time: 1530 Appt. End Time: 1700  09/27/2016  [name], identified by name and date of birth, is a 56 y.o. female with a diagnosis of Diabetes: Type 2. 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 She is frustrated having to take Metformin and would like to know what she has to do to come off of it. She is caring for her elderly mother who lives with her and cared for a brother that passed away last year. As a caregiver, she has little time for exercise. She states she tests her BG occasionally with results typically under 140 mg/dl..  ASSESSMENT  Height 5\' 2"  (1.575 m), weight 138 lb 11.2 oz (62.9 kg). Body mass index is 25.37 kg/m.      Diabetes Self-Management Education - 09/27/16 1542      Visit Information   Visit Type First/Initial     Initial Visit   Diabetes Type Type 2   Are you currently following a meal plan? No   Are you taking your medications as prescribed? Yes   Date Diagnosed 2012     Health Coping   How would you rate your overall health? Good     Psychosocial Assessment   Patient Belief/Attitude about Diabetes Motivated to manage diabetes   Self-care barriers None   Other persons present Patient   Patient Concerns Nutrition/Meal planning;Glycemic Control   How often do you need to have someone help you when you read instructions, pamphlets, or other written materials from your doctor or pharmacy? 1 - Never   What is the last grade level you completed in school? 2 years community college     Pre-Education Assessment   Patient understands the diabetes disease and treatment process. Needs Instruction   Patient understands incorporating nutritional management into lifestyle. Needs Instruction   Patient undertands incorporating physical activity into lifestyle. Needs Instruction   Patient understands using medications  safely. Needs Instruction   Patient understands monitoring blood glucose, interpreting and using results Needs Review   Patient understands prevention, detection, and treatment of acute complications. Needs Instruction   Patient understands prevention, detection, and treatment of chronic complications. Needs Review   Patient understands how to develop strategies to address psychosocial issues. Needs Review   Patient understands how to develop strategies to promote health/change behavior. Needs Review     Complications   Last HgB A1C per patient/outside source 6.2 %   How often do you check your blood sugar? 3-4 times / week   Have you had a dilated eye exam in the past 12 months? Yes   Have you had a dental exam in the past 12 months? Yes   Are you checking your feet? No     Dietary Intake   Breakfast 3 PM: restaurant hot meal of meat, vegetables, starch,    Snack (morning)  occasionally chips or maybe a candy bar    Lunch 1-3 AM: brings left overs or 2nd half of meal eaten earlier OR meal salad with meat and dressing   Beverage(s) water, diet soda, lemon water, coffee with milk,      Exercise   Exercise Type ADL's  no added exercise lately due to ailing mother who lives with her     Patient Education   Previous Diabetes Education No   Disease state  Definition of diabetes, type 1 and 2, and  the diagnosis of diabetes   Nutrition management  Role of diet in the treatment of diabetes and the relationship between the three main macronutrients and blood glucose level;Food label reading, portion sizes and measuring food.;Carbohydrate counting   Physical activity and exercise  Role of exercise on diabetes management, blood pressure control and cardiac health.;Helped patient identify appropriate exercises in relation to his/her diabetes, diabetes complications and other health issue.   Medications Reviewed patients medication for diabetes, action, purpose, timing of dose and side effects.    Monitoring Identified appropriate SMBG and/or A1C goals.   Chronic complications Relationship between chronic complications and blood glucose control   Psychosocial adjustment Role of stress on diabetes;Identified and addressed patients feelings and concerns about diabetes     Individualized Goals (developed by patient)   Nutrition Follow meal plan discussed   Physical Activity Exercise 3-5 times per week   Medications take my medication as prescribed   Monitoring  test blood glucose pre and post meals as discussed     Post-Education Assessment   Patient understands the diabetes disease and treatment process. Demonstrates understanding / competency   Patient understands incorporating nutritional management into lifestyle. Demonstrates understanding / competency   Patient undertands incorporating physical activity into lifestyle. Demonstrates understanding / competency   Patient understands using medications safely. Demonstrates understanding / competency   Patient understands monitoring blood glucose, interpreting and using results Demonstrates understanding / competency   Patient understands prevention, detection, and treatment of acute complications. Demonstrates understanding / competency   Patient understands prevention, detection, and treatment of chronic complications. Demonstrates understanding / competency   Patient understands how to develop strategies to address psychosocial issues. Demonstrates understanding / competency   Patient understands how to develop strategies to promote health/change behavior. Demonstrates understanding / competency     Outcomes   Expected Outcomes Demonstrated interest in learning. Expect positive outcomes   Future DMSE PRN   Program Status Completed     Individualized Plan for Diabetes Self-Management Training:   Learning Objective:  Patient will have a greater understanding of diabetes self-management. Patient education plan is to attend  individual and/or group sessions per assessed needs and concerns.   Plan:  Patient Instructions  Plan:  Aim for 2-3 Carb Choices per meal (30-45 grams)   Aim for 0-1 Carbs per snack if hungry  Include protein in moderation with your meals and snacks Consider reading food labels for Total Carbohydrate of foods When you are able, consider  increasing your activity level daily as tolerated Consider looking into Arm Chair Exercise videos on the computer as a back up plan for increasing your activity level. Consider checking BG at alternate times per day  Continue taking medication as directed by MD  Expected Outcomes:  Demonstrated interest in learning. Expect positive outcomes  Education material provided: Living Well with Diabetes hand out, A1C conversion sheet, Meal plan card and Carbohydrate counting sheet  If problems or questions, patient to contact team via:  Phone  Future DSME appointment: PRN

## 2016-11-15 ENCOUNTER — Ambulatory Visit (INDEPENDENT_AMBULATORY_CARE_PROVIDER_SITE_OTHER): Payer: Federal, State, Local not specified - PPO | Admitting: Physician Assistant

## 2016-11-15 ENCOUNTER — Encounter: Payer: Self-pay | Admitting: Physician Assistant

## 2016-11-15 VITALS — BP 132/68 | HR 80 | Temp 98.0°F | Resp 18 | Ht 62.09 in | Wt 141.4 lb

## 2016-11-15 DIAGNOSIS — N898 Other specified noninflammatory disorders of vagina: Secondary | ICD-10-CM

## 2016-11-15 DIAGNOSIS — L84 Corns and callosities: Secondary | ICD-10-CM

## 2016-11-15 DIAGNOSIS — R8299 Other abnormal findings in urine: Secondary | ICD-10-CM | POA: Diagnosis not present

## 2016-11-15 DIAGNOSIS — R82998 Other abnormal findings in urine: Secondary | ICD-10-CM

## 2016-11-15 LAB — POCT WET + KOH PREP
Trich by wet prep: ABSENT
Yeast by KOH: ABSENT
Yeast by wet prep: ABSENT

## 2016-11-15 LAB — POC MICROSCOPIC URINALYSIS (UMFC): Mucus: ABSENT

## 2016-11-15 LAB — POCT URINALYSIS DIP (MANUAL ENTRY)
Bilirubin, UA: NEGATIVE
Blood, UA: NEGATIVE
Glucose, UA: NEGATIVE mg/dL
Nitrite, UA: NEGATIVE
Protein Ur, POC: NEGATIVE mg/dL
Spec Grav, UA: 1.025 (ref 1.010–1.025)
Urobilinogen, UA: 0.2 E.U./dL
pH, UA: 5.5 (ref 5.0–8.0)

## 2016-11-15 NOTE — Patient Instructions (Addendum)
There is no yeast or bacterial vaginosis noted on your wet prep. I am going to send off a culture to make sure you are not having bacteria in your urine.  You can try vaginal replens for your symptoms. If these symptoms continue, I recommend following up with your gynecologist. Thank you for letting me participate in your health and well being.     IF you received an x-ray today, you will receive an invoice from [facility]. Please contact [facility] at [phone] with questions or concerns regarding your invoice.   IF you received labwork today, you will receive an invoice from [facility] [place]. Please contact [facility] at [phone] with questions or concerns regarding your invoice.   Our billing staff will not be able to assist you with questions regarding bills from these companies.  You will be contacted with the lab results as soon as they are available. The fastest way to get your results is to activate your My Chart account. Instructions are located on the last page of this paperwork. If you have not heard from [place] regarding the results in 2 weeks, please contact this office.    We recommend that you schedule a mammogram for breast cancer screening. Typically, you do not need a referral to do this. Please contact a local imaging center to schedule your mammogram.  [facility] - [phone]  *ask for the Radiology Department [facility]) - [phone] or [phone]  [facility] - [phone] [facility] - [phone] [facility] - [phone]  *ask for the Radiology Department [facility] - [phone]  *ask for the Radiology Department [facility] - [phone]  *ask for the Mammography Department [facility] - [phone]

## 2016-11-15 NOTE — Progress Notes (Signed)
11/16/2016 at 9:42 PM  Kelly Fitzpatrick / DOB: 1960/11/27 / MRN: [id number]  The patient has Hyperlipidemia and Diabetes (HCC) on her problem list.  SUBJECTIVE  Kelly Fitzpatrick is a 56 y.o. female who complains of vaginal discharge x 1 month. Discharge is described as yellowish. No odor. She denies dysuria, hematuria, urinary frequency, urinary urgency, flank pain, abdominal pain, pelvic pain vaginal dryness, and genital irritation. Has tried garlic with no relief. Most recent UTI prior to this was 07/2016. Pt is not sexually active. She is menopausal. Last GC/Chlamydia was in 07/2016 and was negative. Has hx of hysterectomy in 1997. Has been dealing with vaginal discharge on and off since then. Denies use of vaginal douches.   Pt would also like a referral to a podiatrist for corns and calluses of her feet. She has hx of diabetes.   She  has a past medical history of Allergy; Anemia; Diabetes mellitus; Endometriosis; Hypercholesteremia; and Sickle cell trait (HCC).    Medications reviewed and updated by myself where necessary, and exist elsewhere in the encounter.   Ms. Ozbun is allergic to cephalexin; amoxicillin; hyoscyamine sulfate; and sulfa antibiotics. She  reports that she has never smoked. She has never used smokeless tobacco. She reports that she does not drink alcohol or use drugs. She  reports that she does not engage in sexual activity. The patient  has a past surgical history that includes Abdominal hysterectomy; Colostomy; Colostomy closure; and Colon surgery.  Her family history includes Cancer in her father and mother; Heart disease in her brother; Hypertension in her mother; Multiple sclerosis in her brother.  Review of Systems  Constitutional: Negative for chills, diaphoresis and fever.  Gastrointestinal: Negative for abdominal pain, nausea and vomiting.    OBJECTIVE  Her  height is 5' 2.09" (1.577 m) and weight is 141 lb 6.4 oz (64.1 kg). Her oral temperature is 98 F  (36.7 C). Her blood pressure is 132/68 and her pulse is 80. Her respiration is 18 and oxygen saturation is 100%.  The patient's body mass index is 25.79 kg/m.  Physical Exam  Constitutional: She is oriented to person, place, and time. She appears well-developed and well-nourished.  HENT:  Head: Normocephalic and atraumatic.  Eyes: Conjunctivae are normal.  Neck: Normal range of motion.  Pulmonary/Chest: Effort normal.  Genitourinary: There is no tenderness or lesion on the right labia. There is no tenderness or lesion on the left labia. Vaginal discharge (mild amount of yellow creamy discharge in vaginal canal) found.  Genitourinary Comments: S/p total hysterectomy  Neurological: She is alert and oriented to person, place, and time.  Skin: Skin is warm and dry.  Multiple hyperkeratotic papules on plantar aspect of foot.   Psychiatric: She has a normal mood and affect.  Vitals reviewed.   No results found for this or any previous visit (from the past 24 hour(s)).  ASSESSMENT & PLAN  Samaira was seen today for vaginal discharge.  Diagnoses and all orders for this visit:  Discharge from the vagina -     POCT urinalysis dipstick -     POCT Microscopic Urinalysis ([facility]) -     POCT Wet + KOH Prep  Leukocytes in urine -     Urine Culture  Corns and callus -     Ambulatory referral to Podiatry   POCT Wet prep with no clue cells or yeast. PE shows physiologic normal vaginal discharge. Pt reassured. Recommended to follow up with gynecologist if these symptoms continue  to persist and are bothersome for pt.   The patient was advised to call or come back to clinic if she does not see an improvement in symptoms, or worsens with the above plan.   Benjiman Core, PA-C [facility] 11/16/2016 9:42 PM

## 2016-11-16 LAB — URINE CULTURE

## 2017-02-01 ENCOUNTER — Other Ambulatory Visit: Payer: Self-pay | Admitting: Physician Assistant

## 2017-02-01 DIAGNOSIS — E119 Type 2 diabetes mellitus without complications: Secondary | ICD-10-CM

## 2017-03-21 DIAGNOSIS — K080 Exfoliation of teeth due to systemic causes: Secondary | ICD-10-CM | POA: Diagnosis not present

## 2017-03-27 ENCOUNTER — Other Ambulatory Visit: Payer: Self-pay | Admitting: Physician Assistant

## 2017-03-27 DIAGNOSIS — K080 Exfoliation of teeth due to systemic causes: Secondary | ICD-10-CM | POA: Diagnosis not present

## 2017-03-27 DIAGNOSIS — E119 Type 2 diabetes mellitus without complications: Secondary | ICD-10-CM

## 2017-04-03 DIAGNOSIS — K080 Exfoliation of teeth due to systemic causes: Secondary | ICD-10-CM | POA: Diagnosis not present

## 2017-04-20 ENCOUNTER — Other Ambulatory Visit: Payer: Self-pay

## 2017-04-20 ENCOUNTER — Encounter: Payer: Self-pay | Admitting: Family Medicine

## 2017-04-20 ENCOUNTER — Ambulatory Visit: Payer: Federal, State, Local not specified - PPO | Admitting: Family Medicine

## 2017-04-20 VITALS — BP 130/80 | HR 92 | Temp 98.3°F | Ht 62.6 in | Wt 153.4 lb

## 2017-04-20 DIAGNOSIS — E119 Type 2 diabetes mellitus without complications: Secondary | ICD-10-CM | POA: Diagnosis not present

## 2017-04-20 DIAGNOSIS — Z1231 Encounter for screening mammogram for malignant neoplasm of breast: Secondary | ICD-10-CM | POA: Diagnosis not present

## 2017-04-20 DIAGNOSIS — M8588 Other specified disorders of bone density and structure, other site: Secondary | ICD-10-CM

## 2017-04-20 DIAGNOSIS — L739 Follicular disorder, unspecified: Secondary | ICD-10-CM | POA: Diagnosis not present

## 2017-04-20 MED ORDER — GLUCOSE BLOOD VI STRP: 1.0000 | ORAL_STRIP | Freq: Every day | 0 refills | Status: DC

## 2017-04-20 NOTE — Patient Instructions (Signed)
     IF you received an x-ray today, you will receive an invoice from [facility]. Please contact [facility] at [phone] with questions or concerns regarding your invoice.   IF you received labwork today, you will receive an invoice from [facility]. Please contact [facility] at [phone] with questions or concerns regarding your invoice.   Our billing staff will not be able to assist you with questions regarding bills from these companies.  You will be contacted with the lab results as soon as they are available. The fastest way to get your results is to activate your My Chart account. Instructions are located on the last page of this paperwork. If you have not heard from us regarding the results in 2 weeks, please contact this office.     

## 2017-04-20 NOTE — Progress Notes (Signed)
2/28/20194:25 PM  Kelly Fitzpatrick 1960/04/09, 57 y.o. female [id number]  Chief Complaint  Patient presents with  . Hemorrhoids    noticed a couple days tender to touch during urination. Also having discomfort in the right breast. Noticing visual difference in breast  . Referral    wanting referral for bone density scan    HPI:   Patient is a 57 y.o. female with past medical history significant for osteopenia, dm2, htn who presents today with several concerns ,  1. Requesting dexa, last dexa 2012, osteopenia, t score -1.6, spine. Takes otc vit d, good dietary calcium, does not smoke, does not exercise regularly  2. Requesting mammogram referral, ordered in 06/2016 with her last physical, at that time was having right breast tenderness from repetitive lifting at work, that has now resolved, would like to proceed with mammogram. Denies h/o abnormal, denies any lump, skin changes, nipple discharge, swollen glands. Her mother has breast cancer in her 26s. No fhx ovarian cancer.   3. She is also wondering about hemorrhoids, has a discomfort in her perineal area for past several days  4. She is also requesting refill of test stripes, does not check daily, is due for her DM check, PCP Barnett Abu.   Depression screen [facility] 2/9 04/20/2017 11/15/2016 09/27/2016  Decreased Interest 0 0 0  Down, Depressed, Hopeless 0 0 0  PHQ - 2 Score 0 0 0    Allergies  Allergen Reactions  . Cephalexin Other (See Comments)    unknown  . Amoxicillin Rash  . Hyoscyamine Sulfate Rash  . Sulfa Antibiotics Rash    Prior to Admission medications   Medication Sig Start Date End Date Taking? Authorizing Provider  aspirin 81 MG chewable tablet Chew 81 mg by mouth daily.   Yes [provider]  cholecalciferol (VITAMIN D) 400 UNITS TABS Take 400 Units by mouth daily.   Yes [provider]  Garlic 10 MG CAPS Take 1 tablet by mouth daily.   Yes [provider]  glucose blood (ONE TOUCH ULTRA  TEST) test strip 1 each by Other route daily. Test blood glucose with fingerstick once daily 02/12/16  Yes Jeffery, Chelle, PA-C  KRILL OIL PO Take by mouth.   Yes [provider]  Lancets MISC To check blood sugar bid dx code 250.00 06/29/15  Yes Peyton Najjar, MD  lisinopril (PRINIVIL,ZESTRIL) 5 MG tablet TAKE 1 TABLET BY MOUTH EVERY DAY 02/01/17  Yes Barnett Abu, [place] D, PA-C  loratadine-pseudoephedrine (CLARITIN-D 12-HOUR) 5-120 MG per tablet Take 1 tablet by mouth 2 (two) times daily as needed. Reported on 06/29/2015   Yes [provider]  metFORMIN (GLUCOPHAGE) 500 MG tablet Take 1 tablet (500 mg total) by mouth 2 (two) times daily with a meal. Office visit needed for refills 03/27/17  Yes Barnett Abu, [place] D, PA-C  pravastatin (PRAVACHOL) 40 MG tablet Take 1 tablet (40 mg total) by mouth daily. 08/08/16  Yes Benjiman Core D, PA-C  calcium gluconate 500 MG tablet Take 500 mg by mouth daily. Reported on 06/29/2015    [provider]    Past Medical History:  Diagnosis Date  . Allergy   . Anemia   . Diabetes mellitus   . Endometriosis   . Hypercholesteremia   . Sickle cell trait [facility])     Past Surgical History:  Procedure Laterality Date  . ABDOMINAL HYSTERECTOMY    . COLON SURGERY    . COLOSTOMY    . COLOSTOMY CLOSURE  Social History   Tobacco Use  . Smoking status: Never Smoker  . Smokeless tobacco: Never Used  Substance Use Topics  . Alcohol use: No    Family History  Problem Relation Age of Onset  . Hypertension Mother   . Cancer Mother        breast cancer  . Cancer Father        lung cancer  . Multiple sclerosis Brother   . Heart disease Brother        heart transplant    Review of Systems  Constitutional: Negative for chills and fever.  Respiratory: Negative for cough and shortness of breath.   Cardiovascular: Negative for chest pain, palpitations and leg swelling.  Gastrointestinal: Negative for abdominal pain, nausea and  vomiting.     OBJECTIVE:  Blood pressure 130/80, pulse 92, temperature 98.3 F (36.8 C), temperature source Oral, height 5' 2.6" (1.59 m), weight 153 lb 6.4 oz (69.6 kg), SpO2 98 %.  Physical Exam  Constitutional: She is oriented to person, place, and time and well-developed, well-nourished, and in no distress.  HENT:  Head: Normocephalic and atraumatic.  Mouth/Throat: Oropharynx is clear and moist. No oropharyngeal exudate.  Eyes: EOM are normal. Pupils are equal, round, and reactive to light. No scleral icterus.  Neck: Neck supple.  Cardiovascular: Normal rate, regular rhythm and normal heart sounds. Exam reveals no gallop and no friction rub.  No murmur heard. Pulmonary/Chest: Effort normal and breath sounds normal. She has no wheezes. She has no rales. Right breast exhibits no inverted nipple, no mass, no nipple discharge, no skin change and no tenderness. Left breast exhibits no inverted nipple, no mass, no nipple discharge, no skin change and no tenderness. Breasts are symmetrical.  Genitourinary: Vagina normal. Rectal exam shows no external hemorrhoid and no internal hemorrhoid. Vulva exhibits rash (mild folliculitis of right perineum). Vulva exhibits no erythema, no exudate and no lesion. No vaginal discharge found.  Musculoskeletal: She exhibits no edema.  Lymphadenopathy:    She has no axillary adenopathy.       Right: No supraclavicular adenopathy present.       Left: No supraclavicular adenopathy present.  Neurological: She is alert and oriented to person, place, and time. Gait normal.  Skin: Skin is warm and dry.    ASSESSMENT and PLAN  1. Osteopenia of lumbar spine - DG Bone Density; Future  2. Visit for screening mammogram - MM DIGITAL SCREENING BILATERAL; Future  3. Folliculitis of perineum Discussed supportive measures and RTC precautions  4. Type 2 diabetes mellitus without complication, without long-term current use of insulin (HCC) - glucose blood (ONE  TOUCH ULTRA TEST) test strip; 1 each by Other route daily. Test blood glucose with fingerstick once daily  Return in about 3 weeks (around 05/11/2017) for Diabetes.    Myles LippsIrma M Santiago, MD Primary Care at [facility] [address] [place], [place] [address] Ph.  [phone] Fax [phone]

## 2017-04-25 ENCOUNTER — Other Ambulatory Visit: Payer: Self-pay | Admitting: Physician Assistant

## 2017-04-25 DIAGNOSIS — E119 Type 2 diabetes mellitus without complications: Secondary | ICD-10-CM

## 2017-04-25 NOTE — Telephone Encounter (Signed)
Attempted to contact regarding prescription refill; left message on voicemail [phone]; per pharmacy note pt needs office visit for more refills.

## 2017-04-25 NOTE — Telephone Encounter (Signed)
Pt advised that she needs an appt. She states she has a month supply left and will call back and make an appt at a later date.

## 2017-05-06 ENCOUNTER — Other Ambulatory Visit: Payer: Self-pay

## 2017-05-06 DIAGNOSIS — E119 Type 2 diabetes mellitus without complications: Secondary | ICD-10-CM

## 2017-05-11 ENCOUNTER — Other Ambulatory Visit: Payer: Self-pay

## 2017-05-11 DIAGNOSIS — E119 Type 2 diabetes mellitus without complications: Secondary | ICD-10-CM

## 2017-05-11 MED ORDER — LISINOPRIL 5 MG PO TABS: 5.0000 mg | ORAL_TABLET | Freq: Every day | ORAL | 0 refills | Status: DC

## 2017-05-12 ENCOUNTER — Other Ambulatory Visit: Payer: Self-pay | Admitting: Family Medicine

## 2017-05-12 DIAGNOSIS — E119 Type 2 diabetes mellitus without complications: Secondary | ICD-10-CM

## 2017-05-15 ENCOUNTER — Other Ambulatory Visit: Payer: Self-pay

## 2017-05-15 ENCOUNTER — Encounter: Payer: Self-pay | Admitting: Family Medicine

## 2017-05-15 ENCOUNTER — Ambulatory Visit: Payer: Federal, State, Local not specified - PPO | Admitting: Family Medicine

## 2017-05-15 VITALS — BP 118/74 | HR 85 | Temp 97.8°F | Ht 62.6 in | Wt 150.0 lb

## 2017-05-15 DIAGNOSIS — E119 Type 2 diabetes mellitus without complications: Secondary | ICD-10-CM | POA: Diagnosis not present

## 2017-05-15 DIAGNOSIS — E785 Hyperlipidemia, unspecified: Secondary | ICD-10-CM

## 2017-05-15 LAB — POCT GLYCOSYLATED HEMOGLOBIN (HGB A1C): Hemoglobin A1C: 7.6

## 2017-05-15 MED ORDER — LISINOPRIL 5 MG PO TABS: 5.0000 mg | ORAL_TABLET | Freq: Every day | ORAL | 3 refills | Status: DC

## 2017-05-15 MED ORDER — METFORMIN HCL 500 MG PO TABS: 1000.0000 mg | ORAL_TABLET | Freq: Two times a day (BID) | ORAL | 5 refills | Status: DC

## 2017-05-15 MED ORDER — PRAVASTATIN SODIUM 40 MG PO TABS: 40.0000 mg | ORAL_TABLET | Freq: Every day | ORAL | 3 refills | Status: DC

## 2017-05-15 MED ORDER — LANCETS MISC: 0 refills | Status: AC

## 2017-05-15 MED ORDER — GLUCOSE BLOOD VI STRP: 1.0000 | ORAL_STRIP | Freq: Every day | 0 refills | Status: DC

## 2017-05-15 NOTE — Patient Instructions (Addendum)
   IF you received an x-ray today, you will receive an invoice from [facility]. Please contact [facility] at [phone] with questions or concerns regarding your invoice.   IF you received labwork today, you will receive an invoice from [facility]. Please contact [facility] at [phone] with questions or concerns regarding your invoice.   Our billing staff will not be able to assist you with questions regarding bills from these companies.  You will be contacted with the lab results as soon as they are available. The fastest way to get your results is to activate your My Chart account. Instructions are located on the last page of this paperwork. If you have not heard from us regarding the results in 2 weeks, please contact this office.     Diabetes Mellitus and Nutrition When you have diabetes (diabetes mellitus), it is very important to have healthy eating habits because your blood sugar (glucose) levels are greatly affected by what you eat and drink. Eating healthy foods in the appropriate amounts, at about the same times every day, can help you:  Control your blood glucose.  Lower your risk of heart disease.  Improve your blood pressure.  Reach or maintain a healthy weight.  Every person with diabetes is different, and each person has different needs for a meal plan. Your health care provider may recommend that you work with a diet and nutrition specialist (dietitian) to make a meal plan that is best for you. Your meal plan may vary depending on factors such as:  The calories you need.  The medicines you take.  Your weight.  Your blood glucose, blood pressure, and cholesterol levels.  Your activity level.  Other health conditions you have, such as heart or kidney disease.  How do carbohydrates affect me? Carbohydrates affect your blood glucose level more than any other type of food. Eating carbohydrates naturally increases the amount of glucose in your  blood. Carbohydrate counting is a method for keeping track of how many carbohydrates you eat. Counting carbohydrates is important to keep your blood glucose at a healthy level, especially if you use insulin or take certain oral diabetes medicines. It is important to know how many carbohydrates you can safely have in each meal. This is different for every person. Your dietitian can help you calculate how many carbohydrates you should have at each meal and for snack. Foods that contain carbohydrates include:  Bread, cereal, rice, pasta, and crackers.  Potatoes and corn.  Peas, beans, and lentils.  Milk and yogurt.  Fruit and juice.  Desserts, such as cakes, cookies, ice cream, and candy.  How does alcohol affect me? Alcohol can cause a sudden decrease in blood glucose (hypoglycemia), especially if you use insulin or take certain oral diabetes medicines. Hypoglycemia can be a life-threatening condition. Symptoms of hypoglycemia (sleepiness, dizziness, and confusion) are similar to symptoms of having too much alcohol. If your health care provider says that alcohol is safe for you, follow these guidelines:  Limit alcohol intake to no more than 1 drink per day for nonpregnant women and 2 drinks per day for men. One drink equals 12 oz of beer, 5 oz of wine, or 1 oz of hard liquor.  Do not drink on an empty stomach.  Keep yourself hydrated with water, diet soda, or unsweetened iced tea.  Keep in mind that regular soda, juice, and other mixers may contain a lot of sugar and must be counted as carbohydrates.  What are tips for following   this plan? Reading food labels  Start by checking the serving size on the label. The amount of calories, carbohydrates, fats, and other nutrients listed on the label are based on one serving of the food. Many foods contain more than one serving per package.  Check the total grams (g) of carbohydrates in one serving. You can calculate the number of servings of  carbohydrates in one serving by dividing the total carbohydrates by 15. For example, if a food has 30 g of total carbohydrates, it would be equal to 2 servings of carbohydrates.  Check the number of grams (g) of saturated and trans fats in one serving. Choose foods that have low or no amount of these fats.  Check the number of milligrams (mg) of sodium in one serving. Most people should limit total sodium intake to less than 2,300 mg per day.  Always check the nutrition information of foods labeled as "low-fat" or "nonfat". These foods may be higher in added sugar or refined carbohydrates and should be avoided.  Talk to your dietitian to identify your daily goals for nutrients listed on the label. Shopping  Avoid buying canned, premade, or processed foods. These foods tend to be high in fat, sodium, and added sugar.  Shop around the outside edge of the grocery store. This includes fresh fruits and vegetables, bulk grains, fresh meats, and fresh dairy. Cooking  Use low-heat cooking methods, such as baking, instead of high-heat cooking methods like deep frying.  Cook using healthy oils, such as olive, canola, or sunflower oil.  Avoid cooking with butter, cream, or high-fat meats. Meal planning  Eat meals and snacks regularly, preferably at the same times every day. Avoid going long periods of time without eating.  Eat foods high in fiber, such as fresh fruits, vegetables, beans, and whole grains. Talk to your dietitian about how many servings of carbohydrates you can eat at each meal.  Eat 4-6 ounces of lean protein each day, such as lean meat, chicken, fish, eggs, or tofu. 1 ounce is equal to 1 ounce of meat, chicken, or fish, 1 egg, or 1/4 cup of tofu.  Eat some foods each day that contain healthy fats, such as avocado, nuts, seeds, and fish. Lifestyle   Check your blood glucose regularly.  Exercise at least 30 minutes 5 or more days each week, or as told by your health care  provider.  Take medicines as told by your health care provider.  Do not use any products that contain nicotine or tobacco, such as cigarettes and e-cigarettes. If you need help quitting, ask your health care provider.  Work with a counselor or diabetes educator to identify strategies to manage stress and any emotional and social challenges. What are some questions to ask my health care provider?  Do I need to meet with a diabetes educator?  Do I need to meet with a dietitian?  What number can I call if I have questions?  When are the best times to check my blood glucose? Where to find more information:  American Diabetes Association: diabetes.org/food-and-fitness/food  Academy of Nutrition and Dietetics: www.eatright.org/resources/health/diseases-and-conditions/diabetes  National Institute of Diabetes and Digestive and Kidney Diseases (NIH): www.niddk.nih.gov/health-information/diabetes/overview/diet-eating-physical-activity Summary  A healthy meal plan will help you control your blood glucose and maintain a healthy lifestyle.  Working with a diet and nutrition specialist (dietitian) can help you make a meal plan that is best for you.  Keep in mind that carbohydrates and alcohol have immediate effects on your blood glucose   levels. It is important to count carbohydrates and to use alcohol carefully. This information is not intended to replace advice given to you by your health care provider. Make sure you discuss any questions you have with your health care provider. Document Released: 11/04/2004 Document Revised: 03/14/2016 Document Reviewed: 03/14/2016 Elsevier Interactive Patient Education  2018 [employer]  

## 2017-05-15 NOTE — Progress Notes (Signed)
3/25/20199:29 AM  Kelly Fitzpatrick 19-Jun-1960, 57 y.o. female [id number]  Chief Complaint  Patient presents with  . Medication Refill    needing refill on bp meds and diabetes medication. Wants info on the freestyle glucose monitor.     HPI:   Patient is a 57 y.o. female with past medical history significant for DM2 and HLP who presents today for followup. Last DM visit in June 2018 Last a1c 07/2016 - 6.5 Checks her cbgs occasionally Has eye appt scheduled for may, denies any h/o retinopathy Denies any burning, numbness or tingling of her feet Has not been following her diet or exercising for past 6 months or so She takes all her medications as prescribed, denies any side effects She has no acute concerns today  [facility] 05/15/2017 04/20/2017 11/15/2016  Decreased Interest 0 0 0  Down, Depressed, Hopeless 0 0 0  PHQ - 2 Score 0 0 0    Allergies  Allergen Reactions  . Cephalexin Other (See Comments)    unknown  . Amoxicillin Rash  . Hyoscyamine Sulfate Rash  . Sulfa Antibiotics Rash    Prior to Admission medications   Medication Sig Start Date End Date Taking? Authorizing Provider  aspirin 81 MG chewable tablet Chew 81 mg by mouth daily.   Yes [provider]  cholecalciferol (VITAMIN D) 400 UNITS TABS Take 400 Units by mouth daily.   Yes [provider]  Garlic 10 MG CAPS Take 1 tablet by mouth daily.   Yes [provider]  KRILL OIL PO Take by mouth.   Yes [provider]  Lancets MISC To check blood sugar bid dx code 250.00 06/29/15  Yes Peyton NajjarHopper, David H, MD  lisinopril (PRINIVIL,ZESTRIL) 5 MG tablet Take 1 tablet (5 mg total) by mouth daily. 05/11/17  Yes Barnett AbuWiseman, [place] D, PA-C  loratadine-pseudoephedrine (CLARITIN-D 12-HOUR) 5-120 MG per tablet Take 1 tablet by mouth 2 (two) times daily as needed. Reported on 06/29/2015   Yes [provider]  metFORMIN (GLUCOPHAGE) 500 MG tablet Take 1 tablet (500 mg total) by  mouth 2 (two) times daily with a meal. Office visit needed for refills 03/27/17  Yes Barnett AbuWiseman, [place] D, PA-C  ONE TOUCH ULTRA TEST test strip 1 EACH BY OTHER ROUTE DAILY. TEST BLOOD GLUCOSE WITH FINGERSTICK ONCE DAILY 05/12/17  Yes Myles LippsSantiago, Treniya Lobb M, MD  pravastatin (PRAVACHOL) 40 MG tablet Take 1 tablet (40 mg total) by mouth daily. 08/08/16  Yes Benjiman CoreWiseman, Brittany D, PA-C  calcium gluconate 500 MG tablet Take 500 mg by mouth daily. Reported on 06/29/2015    [provider]    Past Medical History:  Diagnosis Date  . Allergy   . Anemia   . Diabetes mellitus   . Endometriosis   . Hypercholesteremia   . Sickle cell trait [facility](HCC)     Past Surgical History:  Procedure Laterality Date  . ABDOMINAL HYSTERECTOMY    . COLON SURGERY    . COLOSTOMY    . COLOSTOMY CLOSURE      Social History   Tobacco Use  . Smoking status: Never Smoker  . Smokeless tobacco: Never Used  Substance Use Topics  . Alcohol use: No    Family History  Problem Relation Age of Onset  . Hypertension Mother   . Cancer Mother        breast cancer  . Cancer Father        lung cancer  . Multiple sclerosis Brother   .  Heart disease Brother        heart transplant    Review of Systems  Constitutional: Negative for chills and fever.  Respiratory: Negative for cough and shortness of breath.   Cardiovascular: Negative for chest pain, palpitations and leg swelling.  Gastrointestinal: Negative for abdominal pain, nausea and vomiting.     OBJECTIVE:  Blood pressure 118/74, pulse 85, temperature 97.8 F (36.6 C), temperature source Oral, height 5' 2.6" (1.59 m), weight 150 lb (68 kg), SpO2 98 %.  Wt Readings from Last 3 Encounters:  05/15/17 150 lb (68 kg)  04/20/17 153 lb 6.4 oz (69.6 kg)  11/15/16 141 lb 6.4 oz (64.1 kg)    Physical Exam  Constitutional: She is oriented to person, place, and time and well-developed, well-nourished, and in no distress.  HENT:  Head: Normocephalic and atraumatic.    Mouth/Throat: Oropharynx is clear and moist. No oropharyngeal exudate.  Eyes: Pupils are equal, round, and reactive to light. EOM are normal. No scleral icterus.  Neck: Neck supple.  Cardiovascular: Normal rate, regular rhythm and normal heart sounds. Exam reveals no gallop and no friction rub.  No murmur heard. Pulmonary/Chest: Effort normal and breath sounds normal. She has no wheezes. She has no rales.  Musculoskeletal: She exhibits no edema.  Neurological: She is alert and oriented to person, place, and time. Gait normal.  Skin: Skin is warm and dry.    Results for orders placed or performed in visit on 05/15/17 (from the past 24 hour(s))  POCT glycosylated hemoglobin (Hb A1C)     Status: None   Collection Time: 05/15/17  9:15 AM  Result Value Ref Range   Hemoglobin A1C 7.6     ASSESSMENT and PLAN  1. Type 2 diabetes mellitus without complication, without long-term current use of insulin (HCC) Worse, above goal. Increasing metformin to 1000mg  BID, discussed if having GI side effects to decrease to 750mg  BID. Discussed importance of diet and exercise. Patient educational handout given. Also discussed checking cbgs daily, alternate between fasting and 2PP dinner.  - POCT glycosylated hemoglobin (Hb A1C) - metFORMIN (GLUCOPHAGE) 500 MG tablet; Take 2 tablets (1,000 mg total) by mouth 2 (two) times daily with a meal. - lisinopril (PRINIVIL,ZESTRIL) 5 MG tablet; Take 1 tablet (5 mg total) by mouth daily. - glucose blood (ONE TOUCH ULTRA TEST) test strip; 1 each by Other route daily. Dx E11.65 - Lancets MISC; To check blood sugar daily dx code E11.65 - Comprehensive metabolic panel - TSH - Lipid panel  2. Hyperlipidemia, unspecified hyperlipidemia type Last LDL 89, reevaluating today. Consider increasing to high intensity statin if needed.  - pravastatin (PRAVACHOL) 40 MG tablet; Take 1 tablet (40 mg total) by mouth daily. - Comprehensive metabolic panel - TSH - Lipid  panel  Return in about 3 months (around 08/15/2017).    Myles Lipps, MD Primary Care at [facility] [address] [place], [place] [address] Ph.  [phone] Fax [phone]

## 2017-05-16 LAB — COMPREHENSIVE METABOLIC PANEL
ALT: 15 IU/L (ref 0–32)
AST: 17 IU/L (ref 0–40)
Albumin/Globulin Ratio: 1.7 (ref 1.2–2.2)
Albumin: 4.3 g/dL (ref 3.5–5.5)
Alkaline Phosphatase: 75 IU/L (ref 39–117)
BUN/Creatinine Ratio: 16 (ref 9–23)
BUN: 14 mg/dL (ref 6–24)
Bilirubin Total: <0.2 mg/dL (ref 0.0–1.2)
CO2: 21 mmol/L (ref 20–29)
Calcium: 9.8 mg/dL (ref 8.7–10.2)
Chloride: 105 mmol/L (ref 96–106)
Creatinine, Ser: 0.87 mg/dL (ref 0.57–1.00)
GFR calc Af Amer: 84 mL/min/{1.73_m2} (ref >59)
GFR calc non Af Amer: 73 mL/min/{1.73_m2} (ref >59)
Globulin, Total: 2.5 g/dL (ref 1.5–4.5)
Glucose: 137 mg/dL — ABNORMAL HIGH (ref 65–99)
Potassium: 4.2 mmol/L (ref 3.5–5.2)
Sodium: 142 mmol/L (ref 134–144)
Total Protein: 6.8 g/dL (ref 6.0–8.5)

## 2017-05-16 LAB — LIPID PANEL
Chol/HDL Ratio: 2.7 ratio (ref 0.0–4.4)
Cholesterol, Total: 138 mg/dL (ref 100–199)
HDL: 51 mg/dL (ref >39)
LDL Calculated: 74 mg/dL (ref 0–99)
Triglycerides: 67 mg/dL (ref 0–149)
VLDL Cholesterol Cal: 13 mg/dL (ref 5–40)

## 2017-05-16 LAB — TSH: TSH: 3.33 u[IU]/mL (ref 0.450–4.500)

## 2017-05-24 ENCOUNTER — Ambulatory Visit
Admission: RE | Admit: 2017-05-24 | Discharge: 2017-05-24 | Disposition: A | Discharge status: Home / Self Care | Payer: Federal, State, Local not specified - PPO | Source: Ambulatory Visit | Attending: Family Medicine | Admitting: Family Medicine

## 2017-05-24 DIAGNOSIS — Z1231 Encounter for screening mammogram for malignant neoplasm of breast: Secondary | ICD-10-CM

## 2017-05-24 DIAGNOSIS — Z780 Asymptomatic menopausal state: Secondary | ICD-10-CM | POA: Diagnosis not present

## 2017-05-24 DIAGNOSIS — M8588 Other specified disorders of bone density and structure, other site: Secondary | ICD-10-CM

## 2017-05-24 DIAGNOSIS — M8589 Other specified disorders of bone density and structure, multiple sites: Secondary | ICD-10-CM | POA: Diagnosis not present

## 2017-06-08 ENCOUNTER — Encounter: Payer: Self-pay | Admitting: Physician Assistant

## 2017-06-08 ENCOUNTER — Other Ambulatory Visit: Payer: Self-pay

## 2017-06-08 ENCOUNTER — Ambulatory Visit: Payer: Federal, State, Local not specified - PPO | Admitting: Physician Assistant

## 2017-06-08 VITALS — BP 118/72 | HR 105 | Temp 98.4°F | Resp 18 | Ht 61.61 in | Wt 143.6 lb

## 2017-06-08 DIAGNOSIS — J309 Allergic rhinitis, unspecified: Secondary | ICD-10-CM

## 2017-06-08 DIAGNOSIS — R0981 Nasal congestion: Secondary | ICD-10-CM | POA: Diagnosis not present

## 2017-06-08 DIAGNOSIS — R05 Cough: Secondary | ICD-10-CM

## 2017-06-08 DIAGNOSIS — R059 Cough, unspecified: Secondary | ICD-10-CM

## 2017-06-08 MED ORDER — GUAIFENESIN ER 1200 MG PO TB12: 1.0000 | ORAL_TABLET | Freq: Two times a day (BID) | ORAL | 1 refills | Status: DC | PRN

## 2017-06-08 MED ORDER — OXYMETAZOLINE HCL 0.05 % NA SOLN: 1.0000 | Freq: Two times a day (BID) | NASAL | 0 refills | Status: DC

## 2017-06-08 NOTE — Progress Notes (Signed)
a    MRN: [id number] DOB: 1961-02-19  Subjective:   Kelly Fitzpatrick is a 57 y.o. female presenting for chief complaint of Cough (X 2 days) and Sinusitis .  Reports 4 day history of illness. Started out with sore throat, which resolved with use of throat spray. Started having dry cough on Tuesday after biking outside. Has some sinus congestion, post nasal drainage. Has tried throat spray, nasal spray, claritin d, nasal wash,  relief. Denies fever, ear pain, wheezing, shortness of breath and chest pain, chills, fatigue, nausea, vomiting, abdominal pain and diarrhea. Has had sick contact with coworkers. Has history of seasonal allergies, no history of asthma.  Denis smoking. Denies any other aggravating or relieving factors, no other questions or concerns.  Kelly Fitzpatrick has a current medication list which includes the following prescription(s): aspirin, cholecalciferol, garlic, glucose blood, krill oil, lancets, lisinopril, loratadine-pseudoephedrine, metformin, pravastatin, and calcium gluconate. Also is allergic to cephalexin; amoxicillin; hyoscyamine sulfate; and sulfa antibiotics.  Kelly Fitzpatrick  has a past medical history of Allergy, Anemia, Diabetes mellitus, Endometriosis, Hypercholesteremia, and Sickle cell trait (HCC). Also  has a past surgical history that includes Abdominal hysterectomy; Colostomy; Colostomy closure; and Colon surgery.   Objective:   Vitals: BP 118/72 (BP Location: Left Arm, Patient Position: Sitting, Cuff Size: Normal)   Pulse (!) 105   Temp 98.4 F (36.9 C) (Oral)   Resp 18   Ht 5' 1.61" (1.565 m)   Wt 143 lb 9.6 oz (65.1 kg)   SpO2 98%   BMI 26.59 kg/m   Physical Exam  Constitutional: She is oriented to person, place, and time. She appears well-developed and well-nourished.  HENT:  Head: Normocephalic and atraumatic.  Right Ear: External ear and ear canal normal. A middle ear effusion is present.  Left Ear: External ear and ear canal normal. A middle ear effusion is  present.  Nose: Mucosal edema (moderate bilaterally) present. Right sinus exhibits no maxillary sinus tenderness and no frontal sinus tenderness. Left sinus exhibits no maxillary sinus tenderness and no frontal sinus tenderness.  Mouth/Throat: Uvula is midline and mucous membranes are normal. Posterior oropharyngeal erythema ( cobblestoning noted) present. No posterior oropharyngeal edema or tonsillar abscesses. No tonsillar exudate.  Eyes: Conjunctivae are normal.  Neck: Normal range of motion.  Cardiovascular: Normal rate, regular rhythm, normal heart sounds and intact distal pulses.  Pulmonary/Chest: Effort normal and breath sounds normal. She has no decreased breath sounds. She has no wheezes. She has no rhonchi. She has no rales.  Lymphadenopathy:       Head (right side): No submental, no submandibular, no tonsillar, no preauricular, no posterior auricular and no occipital adenopathy present.       Head (left side): No submental, no submandibular, no tonsillar, no preauricular, no posterior auricular and no occipital adenopathy present.    She has no cervical adenopathy.       Right: No supraclavicular adenopathy present.       Left: No supraclavicular adenopathy present.  Neurological: She is alert and oriented to person, place, and time.  Skin: Skin is warm and dry.  Psychiatric: She has a normal mood and affect.  Vitals reviewed.   No results found for this or any previous visit (from the past 24 hour(s)).  Assessment and Plan :  1. Allergic rhinitis, unspecified seasonality, unspecified trigger DDx includes worsening allergies due to recent season change vs acute viral URI. PE findings reassuring.  Advised supportive care, offered symptomatic relief. Continue zyrtec-d, nasacort, nasal saline, and humidifier.  Return to clinic if symptoms worsen or fail to improve in 5-7 days, otherwise return to clinic as needed. 2. Cough - Guaifenesin (MUCINEX MAXIMUM STRENGTH) 1200 MG TB12; Take 1  tablet (1,200 mg total) by mouth every 12 (twelve) hours as needed.  Dispense: 14 tablet; Refill: 1 3. Nasal congestion - oxymetazoline (AFRIN) 0.05 % nasal spray; Place 1 spray into both nostrils 2 (two) times daily.  Dispense: 30 mL; Refill: 0 4. Sinus congestion   Benjiman CoreBrittany Rylend Pietrzak, PA-C  [facility] at [facility] 06/08/2017 12:32 PM

## 2017-06-08 NOTE — Patient Instructions (Addendum)
I recommend continue with Claritin-D twice daily as prescribed.  Use afrin in the morning x 3 days, use nasacort x 5-7 days. Do nasal saline spray at night time. Use warm mist humidifier at night. You may also use mucinex during the day. Return to clinic if symptoms worsen, do not improve, or as needed.  Thank you for letting me participate in your health and well being.  Allergic Rhinitis, Adult Allergic rhinitis is an allergic reaction that affects the mucous membrane inside the nose. It causes sneezing, a runny or stuffy nose, and the feeling of mucus going down the back of the throat (postnasal drip). Allergic rhinitis can be mild to severe. There are two types of allergic rhinitis:  Seasonal. This type is also called hay fever. It happens only during certain seasons.  Perennial. This type can happen at any time of the year.  What are the causes? This condition happens when the body's defense system (immune system) responds to certain harmless substances called allergens as though they were germs.  Seasonal allergic rhinitis is triggered by pollen, which can come from grasses, trees, and weeds. Perennial allergic rhinitis may be caused by:  House dust mites.  Pet dander.  Mold spores.  What are the signs or symptoms? Symptoms of this condition include:  Sneezing.  Runny or stuffy nose (nasal congestion).  Postnasal drip.  Itchy nose.  Tearing of the eyes.  Trouble sleeping.  Daytime sleepiness.  How is this diagnosed? This condition may be diagnosed based on:  Your medical history.  A physical exam.  Tests to check for related conditions, such as: ? Asthma. ? Pink eye. ? Ear infection. ? Upper respiratory infection.  Tests to find out which allergens trigger your symptoms. These may include skin or blood tests.  How is this treated? There is no cure for this condition, but treatment can help control symptoms. Treatment may include:  Taking medicines that  block allergy symptoms, such as antihistamines. Medicine may be given as a shot, nasal spray, or pill.  Avoiding the allergen.  Desensitization. This treatment involves getting ongoing shots until your body becomes less sensitive to the allergen. This treatment may be done if other treatments do not help.  If taking medicine and avoiding the allergen does not work, new, stronger medicines may be prescribed.  Follow these instructions at home:  Find out what you are allergic to. Common allergens include smoke, dust, and pollen.  Avoid the things you are allergic to. These are some things you can do to help avoid allergens: ? Replace carpet with wood, tile, or vinyl flooring. Carpet can trap dander and dust. ? Do not smoke. Do not allow smoking in your home. ? Change your heating and air conditioning filter at least once a month. ? During allergy season:  Keep windows closed as much as possible.  Plan outdoor activities when pollen counts are lowest. This is usually during the evening hours.  When coming indoors, change clothing and shower before sitting on furniture or bedding.  Take over-the-counter and prescription medicines only as told by your health care provider.  Keep all follow-up visits as told by your health care provider. This is important. Contact a health care provider if:  You have a fever.  You develop a persistent cough.  You make whistling sounds when you breathe (you wheeze).  Your symptoms interfere with your normal daily activities. Get help right away if:  You have shortness of breath. Summary  This condition can  be managed by taking medicines as directed and avoiding allergens.  Contact your health care provider if you develop a persistent cough or fever.  During allergy season, keep windows closed as much as possible. This information is not intended to replace advice given to you by your health care provider. Make sure you discuss any questions you  have with your health care provider. Document Released: 11/02/2000 Document Revised: 03/17/2016 Document Reviewed: 03/17/2016 Elsevier Interactive Patient Education  2018 [employer]    IF you received an x-ray today, you will receive an invoice from [facility]. Please contact [facility] at [phone] with questions or concerns regarding your invoice.   IF you received labwork today, you will receive an invoice from [facility]. Please contact [facility] at [phone] with questions or concerns regarding your invoice.   Our billing staff will not be able to assist you with questions regarding bills from these companies.  You will be contacted with the lab results as soon as they are available. The fastest way to get your results is to activate your My Chart account. Instructions are located on the last page of this paperwork. If you have not heard from us regarding the results in 2 weeks, please contact this office.

## 2017-06-11 ENCOUNTER — Encounter: Payer: Self-pay | Admitting: Family Medicine

## 2017-06-16 DIAGNOSIS — K080 Exfoliation of teeth due to systemic causes: Secondary | ICD-10-CM | POA: Diagnosis not present

## 2017-06-26 DIAGNOSIS — K080 Exfoliation of teeth due to systemic causes: Secondary | ICD-10-CM | POA: Diagnosis not present

## 2017-07-13 ENCOUNTER — Telehealth: Payer: Self-pay | Admitting: Physician Assistant

## 2017-07-13 NOTE — Telephone Encounter (Signed)
Left VM for pt to call the office. We need to get her rescheduled for her 08/17/17 appt. With [facility]. Barnett Abu is unavailable this day. When pt calls back, please reschedule her at her convenience with Barnett Abu for an A1C check.  Thanks!

## 2017-07-31 ENCOUNTER — Encounter: Payer: Self-pay | Admitting: Physician Assistant

## 2017-07-31 DIAGNOSIS — H01021 Squamous blepharitis right upper eyelid: Secondary | ICD-10-CM | POA: Diagnosis not present

## 2017-07-31 DIAGNOSIS — E119 Type 2 diabetes mellitus without complications: Secondary | ICD-10-CM | POA: Diagnosis not present

## 2017-07-31 DIAGNOSIS — H01022 Squamous blepharitis right lower eyelid: Secondary | ICD-10-CM | POA: Diagnosis not present

## 2017-07-31 DIAGNOSIS — H25813 Combined forms of age-related cataract, bilateral: Secondary | ICD-10-CM | POA: Diagnosis not present

## 2017-07-31 LAB — HM DIABETES EYE EXAM

## 2017-08-17 ENCOUNTER — Ambulatory Visit: Payer: Federal, State, Local not specified - PPO | Admitting: Physician Assistant

## 2017-08-18 ENCOUNTER — Encounter: Payer: Self-pay | Admitting: Physician Assistant

## 2017-08-18 ENCOUNTER — Other Ambulatory Visit: Payer: Self-pay

## 2017-08-18 ENCOUNTER — Ambulatory Visit: Payer: Federal, State, Local not specified - PPO | Admitting: Physician Assistant

## 2017-08-18 VITALS — BP 124/72 | HR 78 | Temp 98.1°F | Resp 18 | Ht 62.36 in | Wt 135.4 lb

## 2017-08-18 DIAGNOSIS — M8588 Other specified disorders of bone density and structure, other site: Secondary | ICD-10-CM

## 2017-08-18 DIAGNOSIS — E785 Hyperlipidemia, unspecified: Secondary | ICD-10-CM

## 2017-08-18 DIAGNOSIS — E119 Type 2 diabetes mellitus without complications: Secondary | ICD-10-CM

## 2017-08-18 DIAGNOSIS — Z1321 Encounter for screening for nutritional disorder: Secondary | ICD-10-CM

## 2017-08-18 LAB — POCT GLYCOSYLATED HEMOGLOBIN (HGB A1C): Hemoglobin A1C: 6.3 % — AB (ref 4.0–5.6)

## 2017-08-18 LAB — POCT URINALYSIS DIP (MANUAL ENTRY)
Bilirubin, UA: NEGATIVE
Blood, UA: NEGATIVE
Glucose, UA: NEGATIVE mg/dL
Nitrite, UA: NEGATIVE
Protein Ur, POC: NEGATIVE mg/dL
Spec Grav, UA: 1.02 (ref 1.010–1.025)
Urobilinogen, UA: 0.2 E.U./dL
pH, UA: 5.5 (ref 5.0–8.0)

## 2017-08-18 MED ORDER — FREESTYLE LIBRE 14 DAY READER DEVI
1.0000 [IU] | Freq: Every day | 0 refills | Status: DC | PRN
Start: 2017-08-18 — End: 2020-04-01

## 2017-08-18 MED ORDER — FREESTYLE LIBRE 14 DAY SENSOR MISC
1.0000 [IU] | 6 refills | Status: DC
Start: 2017-08-18 — End: 2018-05-09

## 2017-08-18 MED ORDER — METFORMIN HCL ER 500 MG PO TB24: 1000.0000 mg | ORAL_TABLET | Freq: Every day | ORAL | 1 refills | Status: DC

## 2017-08-18 NOTE — Patient Instructions (Addendum)
Your A1c is 6.3 which is great.  Congratulations on your weight loss.  Keep up the hard work.  We will decrease her metformin to 1000 mg once daily.  I have given you prescription for the extended release form.  This should cause less GI symptoms.  I also sent a prescription for the freestyle libre monitor.  Please ask pharmacist how to use.  Follow-up in 3 months for reevaluation.  Below is information on calcium and vitamin D levels.  Diabetes Mellitus and Nutrition When you have diabetes (diabetes mellitus), it is very important to have healthy eating habits because your blood sugar (glucose) levels are greatly affected by what you eat and drink. Eating healthy foods in the appropriate amounts, at about the same times every day, can help you:  Control your blood glucose.  Lower your risk of heart disease.  Improve your blood pressure.  Reach or maintain a healthy weight.  Every person with diabetes is different, and each person has different needs for a meal plan. Your health care provider may recommend that you work with a diet and nutrition specialist (dietitian) to make a meal plan that is best for you. Your meal plan may vary depending on factors such as:  The calories you need.  The medicines you take.  Your weight.  Your blood glucose, blood pressure, and cholesterol levels.  Your activity level.  Other health conditions you have, such as heart or kidney disease.  How do carbohydrates affect me? Carbohydrates affect your blood glucose level more than any other type of food. Eating carbohydrates naturally increases the amount of glucose in your blood. Carbohydrate counting is a method for keeping track of how many carbohydrates you eat. Counting carbohydrates is important to keep your blood glucose at a healthy level, especially if you use insulin or take certain oral diabetes medicines. It is important to know how many carbohydrates you can safely have in each meal. This is  different for every person. Your dietitian can help you calculate how many carbohydrates you should have at each meal and for snack. Foods that contain carbohydrates include:  Bread, cereal, rice, pasta, and crackers.  Potatoes and corn.  Peas, beans, and lentils.  Milk and yogurt.  Fruit and juice.  Desserts, such as cakes, cookies, ice cream, and candy.  How does alcohol affect me? Alcohol can cause a sudden decrease in blood glucose (hypoglycemia), especially if you use insulin or take certain oral diabetes medicines. Hypoglycemia can be a life-threatening condition. Symptoms of hypoglycemia (sleepiness, dizziness, and confusion) are similar to symptoms of having too much alcohol. If your health care provider says that alcohol is safe for you, follow these guidelines:  Limit alcohol intake to no more than 1 drink per day for nonpregnant women and 2 drinks per day for men. One drink equals 12 oz of beer, 5 oz of wine, or 1 oz of hard liquor.  Do not drink on an empty stomach.  Keep yourself hydrated with water, diet soda, or unsweetened iced tea.  Keep in mind that regular soda, juice, and other mixers may contain a lot of sugar and must be counted as carbohydrates.  What are tips for following this plan? Reading food labels  Start by checking the serving size on the label. The amount of calories, carbohydrates, fats, and other nutrients listed on the label are based on one serving of the food. Many foods contain more than one serving per package.  Check the total grams (g)  of carbohydrates in one serving. You can calculate the number of servings of carbohydrates in one serving by dividing the total carbohydrates by 15. For example, if a food has 30 g of total carbohydrates, it would be equal to 2 servings of carbohydrates.  Check the number of grams (g) of saturated and trans fats in one serving. Choose foods that have low or no amount of these fats.  Check the number of  milligrams (mg) of sodium in one serving. Most people should limit total sodium intake to less than 2,300 mg per day.  Always check the nutrition information of foods labeled as "low-fat" or "nonfat". These foods may be higher in added sugar or refined carbohydrates and should be avoided.  Talk to your dietitian to identify your daily goals for nutrients listed on the label. Shopping  Avoid buying canned, premade, or processed foods. These foods tend to be high in fat, sodium, and added sugar.  Shop around the outside edge of the grocery store. This includes fresh fruits and vegetables, bulk grains, fresh meats, and fresh dairy. Cooking  Use low-heat cooking methods, such as baking, instead of high-heat cooking methods like deep frying.  Cook using healthy oils, such as olive, canola, or sunflower oil.  Avoid cooking with butter, cream, or high-fat meats. Meal planning  Eat meals and snacks regularly, preferably at the same times every day. Avoid going long periods of time without eating.  Eat foods high in fiber, such as fresh fruits, vegetables, beans, and whole grains. Talk to your dietitian about how many servings of carbohydrates you can eat at each meal.  Eat 4-6 ounces of lean protein each day, such as lean meat, chicken, fish, eggs, or tofu. 1 ounce is equal to 1 ounce of meat, chicken, or fish, 1 egg, or 1/4 cup of tofu.  Eat some foods each day that contain healthy fats, such as avocado, nuts, seeds, and fish. Lifestyle   Check your blood glucose regularly.  Exercise at least 30 minutes 5 or more days each week, or as told by your health care provider.  Take medicines as told by your health care provider.  Do not use any products that contain nicotine or tobacco, such as cigarettes and e-cigarettes. If you need help quitting, ask your health care provider.  Work with a Veterinary surgeoncounselor or diabetes educator to identify strategies to manage stress and any emotional and social  challenges. What are some questions to ask my health care provider?  Do I need to meet with a diabetes educator?  Do I need to meet with a dietitian?  What number can I call if I have questions?  When are the best times to check my blood glucose? Where to find more information:  American Diabetes Association: diabetes.org/food-and-fitness/food  Academy of Nutrition and Dietetics: https://www.vargas.com/www.eatright.org/resources/health/diseases-and-conditions/diabetes  [employer] Institute of Diabetes and Digestive and Kidney Diseases (NIH): FindJewelers.czwww.niddk.nih.gov/health-information/diabetes/overview/diet-eating-physical-activity Summary  A healthy meal plan will help you control your blood glucose and maintain a healthy lifestyle.  Working with a diet and nutrition specialist (dietitian) can help you make a meal plan that is best for you.  Keep in mind that carbohydrates and alcohol have immediate effects on your blood glucose levels. It is important to count carbohydrates and to use alcohol carefully. This information is not intended to replace advice given to you by your health care provider. Make sure you discuss any questions you have with your health care provider. Document Released: 11/04/2004 Document Revised: 03/14/2016 Document Reviewed: 03/14/2016 Elsevier  Interactive Patient Education  2018 Elsevier Inc.   Bone Health Bones protect organs, store calcium, and anchor muscles. Good health habits, such as eating nutritious foods and exercising regularly, are important for maintaining healthy bones. They can also help to prevent a condition that causes bones to lose density and become weak and brittle (osteoporosis). Why is bone mass important? Bone mass refers to the amount of bone tissue that you have. The higher your bone mass, the stronger your bones. An important step toward having healthy bones throughout life is to have strong and dense bones during childhood. A young adult who has a high bone mass  is more likely to have a high bone mass later in life. Bone mass at its greatest it is called peak bone mass. A large decline in bone mass occurs in older adults. In women, it occurs about the time of menopause. During this time, it is important to practice good health habits, because if more bone is lost than what is replaced, the bones will become less healthy and more likely to break (fracture). If you find that you have a low bone mass, you may be able to prevent osteoporosis or further bone loss by changing your diet and lifestyle. How can I find out if my bone mass is low? Bone mass can be measured with an X-ray test that is called a bone mineral density (BMD) test. This test is recommended for all women who are age 53 or older. It may also be recommended for men who are age 60 or older, or for people who are more likely to develop osteoporosis due to:  Having bones that break easily.  Having a long-term disease that weakens bones, such as kidney disease or rheumatoid arthritis.  Having menopause earlier than normal.  Taking medicine that weakens bones, such as steroids, thyroid hormones, or hormone treatment for breast cancer or prostate cancer.  Smoking.  Drinking three or more alcoholic drinks each day.  What are the nutritional recommendations for healthy bones? To have healthy bones, you need to get enough of the right minerals and vitamins. Most nutrition experts recommend getting these nutrients from the foods that you eat. Nutritional recommendations vary from person to person. Ask your health care provider what is healthy for you. Here are some general guidelines. Calcium Recommendations Calcium is the most important (essential) mineral for bone health. Most people can get enough calcium from their diet, but supplements may be recommended for people who are at risk for osteoporosis. Good sources of calcium include:  Dairy products, such as low-fat or nonfat milk, cheese, and  yogurt.  Dark green leafy vegetables, such as bok choy and broccoli.  Calcium-fortified foods, such as orange juice, cereal, bread, soy beverages, and tofu products.  Nuts, such as almonds.  Follow these recommended amounts for daily calcium intake:  Children, age 27?3: 700 mg.  Children, age 44?8: 1,000 mg.  Children, age 98?13: 1,300 mg.  Teens, age 274?18: 1,300 mg.  Adults, age 279?50: 1,000 mg.  Adults, age 72?70: ? Men: 1,000 mg. ? Women: 1,200 mg.  Adults, age 21 or older: 1,200 mg.  Pregnant and breastfeeding females: ? Teens: 1,300 mg. ? Adults: 1,000 mg.  Vitamin D Recommendations Vitamin D is the most essential vitamin for bone health. It helps the body to absorb calcium. Sunlight stimulates the skin to make vitamin D, so be sure to get enough sunlight. If you live in a cold climate or you do not get outside often,  your health care provider may recommend that you take vitamin D supplements. Good sources of vitamin D in your diet include:  Egg yolks.  Saltwater fish.  Milk and cereal fortified with vitamin D.  Follow these recommended amounts for daily vitamin D intake:  Children and teens, age 38?18: 600 international units.  Adults, age 66 or younger: 400-800 international units.  Adults, age 81 or older: 800-1,000 international units.  Other Nutrients Other nutrients for bone health include:  Phosphorus. This mineral is found in meat, poultry, dairy foods, nuts, and legumes. The recommended daily intake for adult men and adult women is 700 mg.  Magnesium. This mineral is found in seeds, nuts, dark green vegetables, and legumes. The recommended daily intake for adult men is 400?420 mg. For adult women, it is 310?320 mg.  Vitamin K. This vitamin is found in green leafy vegetables. The recommended daily intake is 120 mg for adult men and 90 mg for adult women.  What type of physical activity is best for building and maintaining healthy  bones? Weight-bearing and strength-building activities are important for building and maintaining peak bone mass. Weight-bearing activities cause muscles and bones to work against gravity. Strength-building activities increases muscle strength that supports bones. Weight-bearing and muscle-building activities include:  Walking and hiking.  Jogging and running.  Dancing.  Gym exercises.  Lifting weights.  Tennis and racquetball.  Climbing stairs.  Aerobics.  Adults should get at least 30 minutes of moderate physical activity on most days. Children should get at least 60 minutes of moderate physical activity on most days. Ask your health care provide what type of exercise is best for you. Where can I find more information? For more information, check out the following websites:  National Osteoporosis Foundation: http://burton-owens.org/  [employer] of Health: http://www.niams.http://www.johnson-fowler.biz/.asp  This information is not intended to replace advice given to you by your health care provider. Make sure you discuss any questions you have with your health care provider. Document Released: 04/30/2003 Document Revised: 08/28/2015 Document Reviewed: 02/12/2014 Elsevier Interactive Patient Education  2018 [employer].   IF you received an x-ray today, you will receive an invoice from [facility]. Please contact [facility] at [phone] with questions or concerns regarding your invoice.   IF you received labwork today, you will receive an invoice from [employer]. Please contact [facility] at [phone] with questions or concerns regarding your invoice.   Our billing staff will not be able to assist you with questions regarding bills from these companies.  You will be contacted with the lab results as soon as they are available. The fastest way to get your results is to activate your My Chart account. Instructions are  located on the last page of this paperwork. If you have not heard from [place] regarding the results in 2 weeks, please contact this office.

## 2017-08-18 NOTE — Progress Notes (Signed)
MRN: [id number]  Subjective:   Kelly Fitzpatrick is a 57 y.o. female who presents for follow up of Type 2 diabetes mellitus. Last f/u was 04/2017.   A1c at last visit was 7.6.  Metformin was increased to 1000 mg twice daily.  Patient has changed her diet tremendously since last visit.  Was taking metformin 1000 mg twice daily but actually decreased to 1000 once daily due to low blood sugars readings in the 70s.  Reports diarrhea and nausea with use of metformin. Not having many issues with the decrease dose.    Patient is checking home blood sugars. Home blood sugar records: BGs range between 75  and 130. Current symptoms include none. Patient denies foot ulcerations, increased appetite, nausea, paresthesia of the feet, polydipsia, polyuria, visual disturbances and vomiting. Patient is checking their feet daily. No foot concerns. Last diabetic eye exam eye exam was  06/2017, no diabetic retinopathy.   Diet has changed completely. Eating salads, boiled eggs, more fruits. Cut back on a lot of starches. Eating more meat: steak, ham, meatloaf, chicken, and seafood. Still drinking ginger ale for nausea. Drinking water with lemon and lime.  Exercise includes lots of yard work.. Denies smoking or alcohol use.   Known diabetic complications: none  Immunizations: shingles: never, pneumococal vaccine 2018  Other concerns: Still taking lisinopril 5 mg daily and pravastatin '40mg'$ .  Patient unsure of how much vitamin D and calcium she is supposed to be taking.  Past Medical History:  Diagnosis Date  . Allergy   . Anemia   . Diabetes mellitus   . Endometriosis   . Hypercholesteremia   . Sickle cell trait [facility])    Patient Active Problem List   Diagnosis Date Noted  . Hyperlipidemia 06/20/2012  . Diabetes ([place]) 06/20/2012   Social History   Socioeconomic History  . Marital status: Single    Spouse name: Not on file  . Number of children: 0  . Years of education: Not on file  . Highest  education level: Not on file  Occupational History  . Not on file  Social Needs  . Financial resource strain: Not on file  . Food insecurity:    Worry: Not on file    Inability: Not on file  . Transportation needs:    Medical: Not on file    Non-medical: Not on file  Tobacco Use  . Smoking status: Never Smoker  . Smokeless tobacco: Never Used  Substance and Sexual Activity  . Alcohol use: No  . Drug use: No  . Sexual activity: Not Currently    Birth control/protection: Abstinence  Lifestyle  . Physical activity:    Days per week: Not on file    Minutes per session: Not on file  . Stress: Not on file  Relationships  . Social connections:    Talks on phone: Not on file    Gets together: Not on file    Attends religious service: Not on file    Active member of club or organization: Not on file    Attends meetings of clubs or organizations: Not on file    Relationship status: Not on file  . Intimate partner violence:    Fear of current or ex partner: Not on file    Emotionally abused: Not on file    Physically abused: Not on file    Forced sexual activity: Not on file  Other Topics Concern  . Not on file  Social History Narrative  She is from [place], [place]. Lived in [place] for 29 years. Works at [employer] as Engineer, production. Single, no children. Not currently sexually active.    Family History  Problem Relation Age of Onset  . Hypertension Mother   . Cancer Mother        breast cancer  . Breast cancer Mother 62  . Cancer Father        lung cancer  . Multiple sclerosis Brother   . Heart disease Brother        heart transplant  . Breast cancer Maternal Aunt       Objective:   PHYSICAL EXAM BP 124/72 (BP Location: Left Arm, Patient Position: Sitting, Cuff Size: Normal)   Pulse 78   Temp 98.1 F (36.7 C) (Oral)   Resp 18   Ht 5' 2.36" (1.584 m)   Wt 135 lb 6.4 oz (61.4 kg)   SpO2 100%   BMI 24.48 kg/m   Physical Exam  Constitutional: She is  oriented to person, place, and time. She appears well-developed and well-nourished. No distress.  HENT:  Head: Normocephalic and atraumatic.  Mouth/Throat: Uvula is midline, oropharynx is clear and moist and mucous membranes are normal.  Eyes: Pupils are equal, round, and reactive to light. Conjunctivae and EOM are normal.  Neck: Normal range of motion.  Cardiovascular: Normal rate, regular rhythm, normal heart sounds and intact distal pulses.  Pulmonary/Chest: Effort normal and breath sounds normal. She has no wheezes. She has no rhonchi. She has no rales.  Musculoskeletal:       Right lower leg: She exhibits no swelling.       Left lower leg: She exhibits no swelling.  Neurological: She is alert and oriented to person, place, and time.  Skin: Skin is warm and dry.  Psychiatric: She has a normal mood and affect.  Vitals reviewed.   Diabetic Foot Exam - Simple   No data filed      Results for orders placed or performed in visit on 08/18/17 (from the past 24 hour(s))  POCT glycosylated hemoglobin (Hb A1C)     Status: Abnormal   Collection Time: 08/18/17  3:00 PM  Result Value Ref Range   Hemoglobin A1C 6.3 (A) 4.0 - 5.6 %   HbA1c POC (<> result, manual entry)  4.0 - 5.6 %   HbA1c, POC (prediabetic range)  5.7 - 6.4 %   HbA1c, POC (controlled diabetic range)  0.0 - 7.0 %     Wt Readings from Last 3 Encounters:  08/18/17 135 lb 6.4 oz (61.4 kg)  06/08/17 143 lb 9.6 oz (65.1 kg)  05/15/17 150 lb (68 kg)    Assessment and Plan :  1. Type 2 diabetes mellitus without complication, without long-term current use of insulin ([place]) Much improved since last visit. Pt congratulated on weight loss and lifestyle modifications. Will change from IR to XR to see if helps with GI side effects. Continue with '1000mg'$  once daily. Pt would like to try freestyle libre because she is checking blood sugar frequently which is helping control her sugars.  Follow-up in 3 months for reevaluation. - POCT  glycosylated hemoglobin (Hb A1C) - CMP14+EGFR - Microalbumin, urine - Ambulatory referral to Ophthalmology - HM Diabetes Foot Exam - CBC with Differential/Platelet - POCT urinalysis dipstick - metFORMIN (GLUCOPHAGE XR) 500 MG 24 hr tablet; Take 2 tablets (1,000 mg total) by mouth daily with breakfast.  Dispense: 90 tablet; Refill: 1 - Continuous Blood Gluc Sensor (FREESTYLE LIBRE 14  DAY SENSOR) MISC; 1 Units by Does not apply route every 14 (fourteen) days.  Dispense: 2 each; Refill: 6 - Continuous Blood Gluc Receiver (FREESTYLE LIBRE 14 DAY READER) DEVI; 1 Units by Does not apply route daily as needed.  Dispense: 1 Device; Refill: 0  2. Osteopenia of lumbar spine Patient to get at least 800 to 100 IU of vitamin D and 1200 mcg of calcium per day. - VITAMIN D 25 Hydroxy (Vit-D Deficiency, Fractures)  3. Encounter for vitamin deficiency screening Labs pending.  - VITAMIN D 25 Hydroxy (Vit-D Deficiency, Fractures)  4. Hyperlipidemia, unspecified hyperlipidemia type Contniue pravastatin and dietary modifications.    Tenna Delaine, PA-C  [facility] 08/18/2017 3:01 PM

## 2017-08-19 LAB — CBC WITH DIFFERENTIAL/PLATELET
Basophils Absolute: 0 10*3/uL (ref 0.0–0.2)
Basos: 0 %
EOS (ABSOLUTE): 0 10*3/uL (ref 0.0–0.4)
Eos: 1 %
Hematocrit: 35.8 % (ref 34.0–46.6)
Hemoglobin: 11.9 g/dL (ref 11.1–15.9)
Immature Grans (Abs): 0 10*3/uL (ref 0.0–0.1)
Immature Granulocytes: 0 %
Lymphocytes Absolute: 0.8 10*3/uL (ref 0.7–3.1)
Lymphs: 16 %
MCH: 26.9 pg (ref 26.6–33.0)
MCHC: 33.2 g/dL (ref 31.5–35.7)
MCV: 81 fL (ref 79–97)
Monocytes Absolute: 0.6 10*3/uL (ref 0.1–0.9)
Monocytes: 12 %
Neutrophils Absolute: 3.4 10*3/uL (ref 1.4–7.0)
Neutrophils: 71 %
Platelets: 228 10*3/uL (ref 150–450)
RBC: 4.43 x10E6/uL (ref 3.77–5.28)
RDW: 16.5 % — ABNORMAL HIGH (ref 12.3–15.4)
WBC: 4.8 10*3/uL (ref 3.4–10.8)

## 2017-08-19 LAB — CMP14+EGFR
ALT: 10 IU/L (ref 0–32)
AST: 14 IU/L (ref 0–40)
Albumin/Globulin Ratio: 1.7 (ref 1.2–2.2)
Albumin: 4.2 g/dL (ref 3.5–5.5)
Alkaline Phosphatase: 62 IU/L (ref 39–117)
BUN/Creatinine Ratio: 15 (ref 9–23)
BUN: 11 mg/dL (ref 6–24)
Bilirubin Total: 0.3 mg/dL (ref 0.0–1.2)
CO2: 22 mmol/L (ref 20–29)
Calcium: 9.2 mg/dL (ref 8.7–10.2)
Chloride: 104 mmol/L (ref 96–106)
Creatinine, Ser: 0.74 mg/dL (ref 0.57–1.00)
GFR calc Af Amer: 103 mL/min/{1.73_m2} (ref >59)
GFR calc non Af Amer: 89 mL/min/{1.73_m2} (ref >59)
Globulin, Total: 2.5 g/dL (ref 1.5–4.5)
Glucose: 139 mg/dL — ABNORMAL HIGH (ref 65–99)
Potassium: 3.7 mmol/L (ref 3.5–5.2)
Sodium: 140 mmol/L (ref 134–144)
Total Protein: 6.7 g/dL (ref 6.0–8.5)

## 2017-08-19 LAB — MICROALBUMIN, URINE: Microalbumin, Urine: 3 ug/mL

## 2017-08-19 LAB — VITAMIN D 25 HYDROXY (VIT D DEFICIENCY, FRACTURES): Vit D, 25-Hydroxy: 53.8 ng/mL (ref 30.0–100.0)

## 2017-08-28 DIAGNOSIS — K080 Exfoliation of teeth due to systemic causes: Secondary | ICD-10-CM | POA: Diagnosis not present

## 2017-09-05 ENCOUNTER — Ambulatory Visit: Payer: Self-pay | Admitting: Physician Assistant

## 2017-09-05 NOTE — Telephone Encounter (Signed)
Pt called stating that she is having diarrhea (watery) stools that began on Sunday. Pt stated that she would have abdominal cramping before she went to the bathroom to have diarrhea. Pt's diarrhea has decreased to 1 episode today. Pt stated it was watery as well.  Pt stated she is taking the XR version of the Metformin. Pt stated that when she took 2 of the Metformin (not XR) it didn't cause diarrhea . She stated that when she was taking 4 Metformin (not XR) it caused diarrhea and nausea.  Pt works night shift and eats at inconsistent times.  Pt asked if she can switch to 2 of the regular (not XR) metformin. CBG's range 94 mg/dl to 161121 mg/dl  with current the  regimen. Reason for Disposition . MILD-MODERATE diarrhea (e.g., 1-6 times / day more than normal)  Answer Assessment - Initial Assessment Questions 1. DIARRHEA SEVERITY: "How bad is the diarrhea?" "How many extra stools have you had in the past 24 hours than normal?"    - NO DIARRHEA (SCALE 0)   - MILD (SCALE 1-3): Few loose or mushy BMs; increase of 1-3 stools over normal daily number of stools; mild increase in ostomy output.   -  MODERATE (SCALE 4-7): Increase of 4-6 stools daily over normal; moderate increase in ostomy output. * SEVERE (SCALE 8-10; OR 'WORST POSSIBLE'): Increase of 7 or more stools daily over normal; moderate increase in ostomy output; incontinence.     1 this am at work 2. ONSET: "When did the diarrhea begin?"       Sunday 3. BM CONSISTENCY: "How loose or watery is the diarrhea?"      Watery today  4. VOMITING: "Are you also vomiting?" If so, ask: "How many times in the past 24 hours?"      no 5. ABDOMINAL PAIN: "Are you having any abdominal pain?" If yes: "What does it feel like?" (e.g., crampy, dull, intermittent, constant)      Intermittent cramping 6. ABDOMINAL PAIN SEVERITY: If present, ask: "How bad is the pain?"  (e.g., Scale 1-10; mild, moderate, or severe)   - MILD (1-3): doesn't interfere with normal  activities, abdomen soft and not tender to touch    - MODERATE (4-7): interferes with normal activities or awakens from sleep, tender to touch    - SEVERE (8-10): excruciating pain, doubled over, unable to do any normal activities      mild 7. ORAL INTAKE: If vomiting, "Have you been able to drink liquids?" "How much fluids have you had in the past 24 hours?"     No vomiting 8. HYDRATION: "Any signs of dehydration?" (e.g., dry mouth [not just dry lips], too weak to stand, dizziness, new weight loss) "When did you last urinate?"     Lost 5 lbs- 0530 am  9. EXPOSURE: "Have you traveled to a foreign country recently?" "Have you been exposed to anyone with diarrhea?" "Could you have eaten any food that was spoiled?"     No-no-pt does not know [facility] [employer] Saturday and Sunday sx began 10. ANTIBIOTIC USE: "Are you taking antibiotics now or have you taken antibiotics in the past 2 months?"       no 11. OTHER SYMPTOMS: "Do you have any other symptoms?" (e.g., fever, blood in stool)       no 12. PREGNANCY: "Is there any chance you are pregnant?" "When was your last menstrual period?"       n/a  Protocols used: DIARRHEA-A-AH

## 2017-09-06 NOTE — Telephone Encounter (Signed)
Call patiented.  No answer.  Left voicemail to contact us back so I could discuss this with her.  If she calls back and I am not here, please let her know that it is okay to take metformin immediate release 1000 mg only to see if this helps with diarrhea.

## 2017-09-06 NOTE — Telephone Encounter (Signed)
Please advise: Pt with diarrhea. Pt asking if she can change the type of Metformin she takes. See message from R.N.Sarah

## 2017-09-07 NOTE — Telephone Encounter (Signed)
LMOVM with Brittany's message. Advised to take only 1 (1000mg ) metformin due to diarrhea. When diarrhea subsides, return to regular dose. Call the office prn

## 2017-10-09 DIAGNOSIS — K080 Exfoliation of teeth due to systemic causes: Secondary | ICD-10-CM | POA: Diagnosis not present

## 2017-10-24 ENCOUNTER — Telehealth: Payer: Self-pay | Admitting: Physician Assistant

## 2017-10-24 NOTE — Telephone Encounter (Signed)
Copied from CRM [id number]. Topic: Quick Communication - See Telephone Encounter >> Oct 24, 2017  5:04 PM Lorrine Kin, NT wrote: CRM for notification. See Telephone encounter for: 10/24/17. Patient calling and states that she did not use the free style during the summer, but she has been using it lately. States that she likes the free style very much.

## 2017-10-25 NOTE — Telephone Encounter (Signed)
Noted  

## 2017-10-31 ENCOUNTER — Ambulatory Visit: Payer: Self-pay | Admitting: *Deleted

## 2017-10-31 NOTE — Telephone Encounter (Signed)
Pt reports 1 episode of vertigo this afternoon at 1200. 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 Brief episode. States presently lightheaded, mild, intermittent.  Reports BS at 1223 was 74, then up to 91, presently during call 125. Pt denies any other symptoms. States she did take a Cl[place]in-D this am before going to bed for her allergies. States is staying hydrated. Concerned "diabetes medication may need to be adjusted."  Appt made with B. Barnett Abu for tomorrow. Care advise given per protocol.  Reason for Disposition . [1] MODERATE dizziness (e.g., vertigo; feels very unsteady, interferes with normal activities) AND [2] has NOT been evaluated by physician for this  Answer Assessment - Initial Assessment Questions 1. DESCRIPTION: "Describe your dizziness."     Spinning x 1 this am when first awakening 2. VERTIGO: "Do you feel like either you or the room is spinning or tilting?"      Not presently 3. LIGHTHEADED: "Do you feel lightheaded?" (e.g., somewhat faint, woozy, weak upon standing)     Lightheadedness, intermittent 4. SEVERITY: "How bad is it?"  "Can you walk?"   - MILD - Feels unsteady but walking normally.   - MODERATE - Feels very unsteady when walking, but not falling; interferes with normal activities (e.g., school, work) .   - SEVERE - Unable to walk without falling (requires assistance).     Mild 5. ONSET:  "When did the dizziness begin?"     This am 6. AGGRAVATING FACTORS: "Does anything make it worse?" (e.g., standing, change in head position)     no 7. CAUSE: "What do you think is causing the dizziness?"     Unsure 8. RECURRENT SYMPTOM: "Have you had dizziness before?" If so, ask: "When was the last time?" "What happened that time?"     no 9. OTHER SYMPTOMS: "Do you have any other symptoms?" (e.g., headache, weakness, numbness, vomiting, earache)     allergies, took Claritin D this AM  Protocols used: DIZZINESS -  VERTIGO-A-AH

## 2017-11-01 ENCOUNTER — Ambulatory Visit: Payer: Self-pay | Admitting: Physician Assistant

## 2017-11-01 NOTE — Progress Notes (Deleted)
   Kelly Fitzpatrick  MRN: [id number] DOB: 06-11-1960  Subjective:  Kelly Fitzpatrick is a 57 y.o. female seen in office today for a chief complaint of ***  Review of Systems  Patient Active Problem List   Diagnosis Date Noted  . Hyperlipidemia 06/20/2012  . Diabetes (HCC) 06/20/2012    Current Outpatient Medications on File Prior to Visit  Medication Sig Dispense Refill  . aspirin 81 MG chewable tablet Chew 81 mg by mouth daily.    . Calcium Carbonate (CALCIUM 600 PO) Take by mouth.    . calcium gluconate 500 MG tablet Take 500 mg by mouth daily. Reported on 06/29/2015    . cholecalciferol (VITAMIN D) 400 UNITS TABS Take 400 Units by mouth daily.    . Continuous Blood Gluc Receiver (FREESTYLE LIBRE 14 DAY READER) DEVI 1 Units by Does not apply route daily as needed. 1 Device 0  . Continuous Blood Gluc Sensor (FREESTYLE LIBRE 14 DAY SENSOR) MISC 1 Units by Does not apply route every 14 (fourteen) days. 2 each 6  . Garlic 10 MG CAPS Take 1 tablet by mouth daily.    Marland Kitchen glucose blood (ONE TOUCH ULTRA TEST) test strip 1 each by Other route daily. Dx E11.65 100 each 0  . Guaifenesin (MUCINEX MAXIMUM STRENGTH) 1200 MG TB12 Take 1 tablet (1,200 mg total) by mouth every 12 (twelve) hours as needed. (Patient not taking: Reported on 08/18/2017) 14 tablet 1  . KRILL OIL PO Take by mouth.    . Lancets MISC To check blood sugar daily dx code E11.65 100 each 0  . lisinopril (PRINIVIL,ZESTRIL) 5 MG tablet Take 1 tablet (5 mg total) by mouth daily. 90 tablet 3  . loratadine-pseudoephedrine (CLARITIN-D 12-HOUR) 5-120 MG per tablet Take 1 tablet by mouth 2 (two) times daily as needed. Reported on 06/29/2015    . metFORMIN (GLUCOPHAGE XR) 500 MG 24 hr tablet Take 2 tablets (1,000 mg total) by mouth daily with breakfast. 90 tablet 1  . oxymetazoline (AFRIN) 0.05 % nasal spray Place 1 spray into both nostrils 2 (two) times daily. (Patient not taking: Reported on 08/18/2017) 30 mL 0  . pravastatin (PRAVACHOL) 40 MG  tablet Take 1 tablet (40 mg total) by mouth daily. 90 tablet 3   No current facility-administered medications on file prior to visit.     Allergies  Allergen Reactions  . Cephalexin Other (See Comments)    unknown  . Amoxicillin Rash  . Hyoscyamine Sulfate Rash  . Sulfa Antibiotics Rash     Objective:  There were no vitals taken for this visit.  Physical Exam  Assessment and Plan :  *** There are no diagnoses linked to this encounter.   Benjiman Core PA-C  Primary Care at [facility] 11/01/2017 6:58 AM

## 2017-11-02 ENCOUNTER — Ambulatory Visit: Payer: Federal, State, Local not specified - PPO | Admitting: Physician Assistant

## 2017-11-02 ENCOUNTER — Other Ambulatory Visit: Payer: Self-pay

## 2017-11-02 ENCOUNTER — Encounter: Payer: Self-pay | Admitting: Physician Assistant

## 2017-11-02 VITALS — BP 110/62 | HR 91 | Temp 97.9°F | Resp 18 | Ht 62.36 in | Wt 130.6 lb

## 2017-11-02 DIAGNOSIS — E119 Type 2 diabetes mellitus without complications: Secondary | ICD-10-CM | POA: Diagnosis not present

## 2017-11-02 DIAGNOSIS — R42 Dizziness and giddiness: Secondary | ICD-10-CM | POA: Diagnosis not present

## 2017-11-02 DIAGNOSIS — E785 Hyperlipidemia, unspecified: Secondary | ICD-10-CM

## 2017-11-02 LAB — POCT GLYCOSYLATED HEMOGLOBIN (HGB A1C): Hemoglobin A1C: 6.3 % — AB (ref 4.0–5.6)

## 2017-11-02 MED ORDER — GLUCOSE BLOOD VI STRP: ORAL_STRIP | 2 refills | Status: AC

## 2017-11-02 MED ORDER — BLOOD PRESSURE MONITOR DEVI: 1.0000 [IU] | Freq: Every day | 0 refills | Status: AC | PRN

## 2017-11-02 MED ORDER — METFORMIN HCL ER 500 MG PO TB24
500.0000 mg | ORAL_TABLET | Freq: Every day | ORAL | 0 refills | Status: DC
Start: 2017-11-02 — End: 2018-02-08

## 2017-11-02 NOTE — Progress Notes (Signed)
Kelly Fitzpatrick  MRN: [id number] DOB: 05-21-1960  Subjective:  Kelly Fitzpatrick is a 57 y.o. female with PMH T2DM seen in office today for a chief complaint of episode of lightheadedness a few days ago.  At last visit in 07/2017, patient mentions she has changed her diet tremendously.  Her A1c has improved from 7.6 to 6.3.  Metformin XR was decreased from 2000 mg daily to 1000 mg daily.  She was also given Rx for freestyle libre monitor.  Today, she reports that she has been checking her sugars very regularly with the monitor.  She is also continue to work on her diet.  Now that she has the monitor, she is able to identify which foods make her sugars words and continues to improve her diet.  A few days ago, when she woke up, she felt dizziness.  She checked her sugar and it was 74.  Also checked her blood pressure and it was 113/79.  Was also having head congestion that day.  Took Claritin-D and symptoms resolved.  She has not noticed anymore episodes of dizziness since then.  In terms of blood sugar readings, reports frequent readings in the 80s.  Came by the office yesterday and was instructed by another provider to decrease metformin to 500 mg daily.  She took this dose yesterday.  Has not taken any medications today.  She would like to have her A1c checked today.  She continues to lose weight with her diet changes.  She is motivated to get off all of her medications.Denies headache, confusion, blurred vision, double vision, speech difficulty, chest pain, shortness of breath, heart palpitations, nausea, vomiting, diaphoresis, and syncope.  Has PMH of anemia but last CBC in June 2019 showed normal hemoglobin.  No PMH of heart disease, syncope, stroke, or thyroid disorder.   Review of Systems  Per HPI  Patient Active Problem List   Diagnosis Date Noted  . Hyperlipidemia 06/20/2012  . Diabetes ([place]) 06/20/2012    Current Outpatient Medications on File Prior to Visit  Medication Sig Dispense Refill   . aspirin 81 MG chewable tablet Chew 81 mg by mouth daily.    . Calcium Carbonate (CALCIUM 600 PO) Take by mouth.    . calcium gluconate 500 MG tablet Take 500 mg by mouth daily. Reported on 06/29/2015    . cholecalciferol (VITAMIN D) 400 UNITS TABS Take 400 Units by mouth daily.    . Continuous Blood Gluc Receiver (FREESTYLE LIBRE 14 DAY READER) DEVI 1 Units by Does not apply route daily as needed. 1 Device 0  . Continuous Blood Gluc Sensor (FREESTYLE LIBRE 14 DAY SENSOR) MISC 1 Units by Does not apply route every 14 (fourteen) days. 2 each 6  . glucose blood (ONE TOUCH ULTRA TEST) test strip 1 each by Other route daily. Dx E11.65 100 each 0  . Lancets MISC To check blood sugar daily dx code E11.65 100 each 0  . lisinopril (PRINIVIL,ZESTRIL) 5 MG tablet Take 1 tablet (5 mg total) by mouth daily. 90 tablet 3  . loratadine-pseudoephedrine (CLARITIN-D 12-HOUR) 5-120 MG per tablet Take 1 tablet by mouth 2 (two) times daily as needed. Reported on 06/29/2015    . metFORMIN (GLUCOPHAGE XR) 500 MG 24 hr tablet Take 2 tablets (1,000 mg total) by mouth daily with breakfast. 90 tablet 1  . pravastatin (PRAVACHOL) 40 MG tablet Take 1 tablet (40 mg total) by mouth daily. 90 tablet 3  . Garlic 10 MG CAPS Take 1 tablet  by mouth daily.    . Guaifenesin (MUCINEX MAXIMUM STRENGTH) 1200 MG TB12 Take 1 tablet (1,200 mg total) by mouth every 12 (twelve) hours as needed. (Patient not taking: Reported on 08/18/2017) 14 tablet 1  . KRILL OIL PO Take by mouth.    Marland Kitchen oxymetazoline (AFRIN) 0.05 % nasal spray Place 1 spray into both nostrils 2 (two) times daily. (Patient not taking: Reported on 08/18/2017) 30 mL 0   No current facility-administered medications on file prior to visit.     Allergies  Allergen Reactions  . Cephalexin Other (See Comments)    unknown  . Amoxicillin Rash  . Hyoscyamine Sulfate Rash  . Sulfa Antibiotics Rash      Social History   Socioeconomic History  . Marital status: Single    Spouse  name: Not on file  . Number of children: 0  . Years of education: Not on file  . Highest education level: Not on file  Occupational History  . Not on file  Social Needs  . Financial resource strain: Not on file  . Food insecurity:    Worry: Not on file    Inability: Not on file  . Transportation needs:    Medical: Not on file    Non-medical: Not on file  Tobacco Use  . Smoking status: Never Smoker  . Smokeless tobacco: Never Used  Substance and Sexual Activity  . Alcohol use: No  . Drug use: No  . Sexual activity: Not Currently    Birth control/protection: Abstinence  Lifestyle  . Physical activity:    Days per week: Not on file    Minutes per session: Not on file  . Stress: Not on file  Relationships  . Social connections:    Talks on phone: Not on file    Gets together: Not on file    Attends religious service: Not on file    Active member of club or organization: Not on file    Attends meetings of clubs or organizations: Not on file    Relationship status: Not on file  . Intimate partner violence:    Fear of current or ex partner: Not on file    Emotionally abused: Not on file    Physically abused: Not on file    Forced sexual activity: Not on file  Other Topics Concern  . Not on file  Social History Narrative   She is from [place], [place]. Lived in [place] for 29 years. Works at [employer] as Engineer, production. Single, no children. Not currently sexually active.     Objective:  BP 110/62   Pulse 91   Temp 97.9 F (36.6 C) (Oral)   Resp 18   Ht 5' 2.36" (1.584 m)   Wt 130 lb 9.6 oz (59.2 kg)   SpO2 99%   BMI 23.61 kg/m   Physical Exam  Constitutional: She is oriented to person, place, and time. She appears well-developed and well-nourished. No distress.  HENT:  Head: Normocephalic and atraumatic.  Mouth/Throat: Uvula is midline, oropharynx is clear and moist and mucous membranes are normal. No tonsillar exudate.  No pain with palpation of  bilateral temporal regions.   Eyes: Pupils are equal, round, and reactive to light. Conjunctivae and EOM are normal.  Fundoscopic exam:      The right eye shows no hemorrhage and no papilledema. The right eye shows red reflex.       The left eye shows no hemorrhage and no papilledema. The left eye shows  red reflex.  Neck: Normal range of motion and full passive range of motion without pain. No Brudzinski's sign and no Kernig's sign noted.  Cardiovascular: Normal rate, regular rhythm and normal heart sounds.  Pulmonary/Chest: Effort normal and breath sounds normal. She has no decreased breath sounds. She has no wheezes. She has no rhonchi. She has no rales.  Neurological: She is alert and oriented to person, place, and time. She has normal strength and normal reflexes. No cranial nerve deficit or sensory deficit. She displays a negative Romberg sign. Gait normal.  Normal FNF and HTS test.  Normal Tandem walk.  Able to walk on toes with ease. Negative Marye Round.     Skin: Skin is warm and dry.  Psychiatric: She has a normal mood and affect.  Vitals reviewed.    BP Readings from Last 3 Encounters:  11/02/17 110/62  08/18/17 124/72  06/08/17 118/72   Wt Readings from Last 3 Encounters:  11/02/17 130 lb 9.6 oz (59.2 kg)  08/18/17 135 lb 6.4 oz (61.4 kg)  06/08/17 143 lb 9.6 oz (65.1 kg)    Results for orders placed or performed in visit on 11/02/17 (from the past 24 hour(s))  POCT glycosylated hemoglobin (Hb A1C)     Status: Abnormal   Collection Time: 11/02/17  3:16 PM  Result Value Ref Range   Hemoglobin A1C 6.3 (A) 4.0 - 5.6 %   HbA1c POC (<> result, manual entry)     HbA1c, POC (prediabetic range)     HbA1c, POC (controlled diabetic range)      Orthostatic VS for the past 24 hrs:  BP- Lying Pulse- Lying BP- Sitting Pulse- Sitting BP- Standing at 0 minutes Pulse- Standing at 0 minutes  11/02/17 1508 107/67 60 126/74 62 115/77 72     Assessment and Plan :  1.  Dizziness Resolved.  Has not occurred since the one incident a few days ago.  Vitals stable.  No acute findings noted on exam.  Her blood pressure is running on the lower end of normal.  We compared her home monitor with our in office monitor and her home monitor does read about 15 points higher.  Recommend she decrease lisinopril dose from 5 mg daily to 2.5 mg daily.  Given Rx for new blood pressure monitor to use.  A1c is stable at 6.3.  Recommend continuing metformin XR 500 mg daily.  Given strict return precautions. - Orthostatic vital signs - Lipid panel - CMP14+EGFR - CBC with Differential/Platelet - TSH  2. Type 2 diabetes mellitus without complication, without long-term current use of insulin (HCC) Follow-up in 3 months for evaluation. - POCT glycosylated hemoglobin (Hb A1C) - CMP14+EGFR - glucose blood (FREESTYLE PRECISION NEO TEST) test strip; Use as instructed  Dispense: 50 each; Refill: 2 - metFORMIN (GLUCOPHAGE XR) 500 MG 24 hr tablet; Take 1 tablet (500 mg total) by mouth daily with breakfast.  Dispense: 90 tablet; Refill: 0 - Blood Pressure Monitor DEVI; 1 Units by Does not apply route daily as needed.  Dispense: 1 Device; Refill: 0  3. Hyperlipidemia, unspecified hyperlipidemia type - Lipid panel    Tenna Delaine PA-C  Primary Care at [place] 11/02/2017 3:18 PM

## 2017-11-02 NOTE — Patient Instructions (Addendum)
  Continue metformin 500mg  daily. Decrease lisinopril to 2.5mg  daily. Continue checking blood sugar, fasting blood sugar goal is 70-110.   Continue checking blood pressure with decreased lisinopril dose. Goal is <140/90 and >100/60.  Follow up with me in 3 months.    If you have lab work done today you will be contacted with your lab results within the next 2 weeks.  If you have not heard from us then please contact us. The fastest way to get your results is to register for My Chart.   IF you received an x-ray today, you will receive an invoice from [facility]. Please contact [facility] at [phone] with questions or concerns regarding your invoice.   IF you received labwork today, you will receive an invoice from [facility]. Please contact [facility] at [phone] with questions or concerns regarding your invoice.   Our billing staff will not be able to assist you with questions regarding bills from these companies.  You will be contacted with the lab results as soon as they are available. The fastest way to get your results is to activate your My Chart account. Instructions are located on the last page of this paperwork. If you have not heard from us regarding the results in 2 weeks, please contact this office.

## 2017-11-03 LAB — CMP14+EGFR
ALT: 10 IU/L (ref 0–32)
AST: 12 IU/L (ref 0–40)
Albumin/Globulin Ratio: 1.8 (ref 1.2–2.2)
Albumin: 4.4 g/dL (ref 3.5–5.5)
Alkaline Phosphatase: 78 IU/L (ref 39–117)
BUN/Creatinine Ratio: 20 (ref 9–23)
BUN: 17 mg/dL (ref 6–24)
Bilirubin Total: 0.3 mg/dL (ref 0.0–1.2)
CO2: 21 mmol/L (ref 20–29)
Calcium: 9.9 mg/dL (ref 8.7–10.2)
Chloride: 104 mmol/L (ref 96–106)
Creatinine, Ser: 0.84 mg/dL (ref 0.57–1.00)
GFR calc Af Amer: 88 mL/min/{1.73_m2} (ref >59)
GFR calc non Af Amer: 76 mL/min/{1.73_m2} (ref >59)
Globulin, Total: 2.5 g/dL (ref 1.5–4.5)
Glucose: 122 mg/dL — ABNORMAL HIGH (ref 65–99)
Potassium: 4.2 mmol/L (ref 3.5–5.2)
Sodium: 141 mmol/L (ref 134–144)
Total Protein: 6.9 g/dL (ref 6.0–8.5)

## 2017-11-03 LAB — CBC WITH DIFFERENTIAL/PLATELET
Basophils Absolute: 0 10*3/uL (ref 0.0–0.2)
Basos: 0 %
EOS (ABSOLUTE): 0 10*3/uL (ref 0.0–0.4)
Eos: 0 %
Hematocrit: 37.5 % (ref 34.0–46.6)
Hemoglobin: 12.7 g/dL (ref 11.1–15.9)
Immature Grans (Abs): 0 10*3/uL (ref 0.0–0.1)
Immature Granulocytes: 0 %
Lymphocytes Absolute: 0.7 10*3/uL (ref 0.7–3.1)
Lymphs: 15 %
MCH: 27.4 pg (ref 26.6–33.0)
MCHC: 33.9 g/dL (ref 31.5–35.7)
MCV: 81 fL (ref 79–97)
Monocytes Absolute: 0.4 10*3/uL (ref 0.1–0.9)
Monocytes: 8 %
Neutrophils Absolute: 3.6 10*3/uL (ref 1.4–7.0)
Neutrophils: 77 %
Platelets: 213 10*3/uL (ref 150–450)
RBC: 4.63 x10E6/uL (ref 3.77–5.28)
RDW: 15.4 % (ref 12.3–15.4)
WBC: 4.7 10*3/uL (ref 3.4–10.8)

## 2017-11-03 LAB — LIPID PANEL
Chol/HDL Ratio: 2.6 ratio (ref 0.0–4.4)
Cholesterol, Total: 139 mg/dL (ref 100–199)
HDL: 53 mg/dL (ref >39)
LDL Calculated: 76 mg/dL (ref 0–99)
Triglycerides: 49 mg/dL (ref 0–149)
VLDL Cholesterol Cal: 10 mg/dL (ref 5–40)

## 2017-11-03 LAB — TSH: TSH: 1.53 u[IU]/mL (ref 0.450–4.500)

## 2017-11-06 ENCOUNTER — Other Ambulatory Visit: Payer: Self-pay | Admitting: Family Medicine

## 2017-11-06 DIAGNOSIS — E119 Type 2 diabetes mellitus without complications: Secondary | ICD-10-CM

## 2017-11-07 ENCOUNTER — Other Ambulatory Visit: Payer: Self-pay | Admitting: Family Medicine

## 2017-11-07 DIAGNOSIS — E119 Type 2 diabetes mellitus without complications: Secondary | ICD-10-CM

## 2017-11-10 ENCOUNTER — Ambulatory Visit: Payer: Self-pay

## 2017-11-10 NOTE — Telephone Encounter (Signed)
Returned call to pt.  Reported she has been using the [facility] Fre[facility] Monitor for 2 weeks, and voiced concern that her readings are running 50-60.  Reported she compared the scanned blood sugar readings with finger stick readings, and there was a big difference. Reported the following readings on 9/19: scan reading 66 and fingerstick 103; scan reading 76 and fingerstick 106.  Reported she called the company, and was advised this may be related to the monitor, so another monitor will be sent to her.  Reported this AM, her accu check reading was 119.  Pt. Denied having any symptoms of low blood sugar.  Reported she has only had an episode of dizziness that she reported at there last visit, and informed PCP about, and her Metformin was reduced.  Stated no further episodes since then.  Pt. Reported she has lost 20 lbs recently, and questioned if she will need to have her medication adjusted.  Advised to keep diary of her readings, and note any symptoms at time of readings.  Encouraged to call back if she continues to have concerns about her blood sugar readings, after receiving her new monitor. Advised to continue current exercise, diet and medication, as prescribed, and call back with any concerns / questions.        Verb. Understanding.    Reason for Disposition . Health Information question, no triage required and triager able to answer question  Answer Assessment - Initial Assessment Questions 1. REASON FOR CALL or QUESTION: "What is your reason for calling today?" or "How can I best help you?" or "What question do you have that I can help answer?"     I have been getting lower readings on my Freestyle Libre monitor; I am not sure it is accurate.  Protocols used: INFORMATION ONLY CALL-A-AH  Message from Maye HidesSarah Coley sent at 11/10/2017 11:26 AM EDT   Summary: blood sugars/lab results needs advice   Pt states her blood sugars are running in the 50-60 range,another reading was 103.Pt inquiring if she  should skip a dose of metFORMIN (GLUCOPHAGE XR) 500 MG 24 hr tablet.Pt denies having any symptoms and feels fine.She would also like to get lab results

## 2017-11-11 ENCOUNTER — Other Ambulatory Visit: Payer: Self-pay | Admitting: Physician Assistant

## 2017-11-11 DIAGNOSIS — E119 Type 2 diabetes mellitus without complications: Secondary | ICD-10-CM

## 2017-11-27 ENCOUNTER — Ambulatory Visit: Payer: Federal, State, Local not specified - PPO | Admitting: Physician Assistant

## 2018-02-08 ENCOUNTER — Other Ambulatory Visit: Payer: Self-pay | Admitting: Physician Assistant

## 2018-02-08 DIAGNOSIS — E119 Type 2 diabetes mellitus without complications: Secondary | ICD-10-CM

## 2018-02-08 NOTE — Telephone Encounter (Signed)
Requested Prescriptions  Pending Prescriptions Disp Refills  . metFORMIN (GLUCOPHAGE-XR) 500 MG 24 hr tablet [Pharmacy Med Name: METFORMIN HCL ER 500 MG TABLET] 90 tablet 0    Sig: TAKE 1 TABLET BY MOUTH EVERY DAY WITH BREAKFAST     Endocrinology:  Diabetes - Biguanides Passed - 02/08/2018  1:10 AM      Passed - Cr in normal range and within 360 days    Creat  Date Value Ref Range Status  09/25/2015 0.85 0.50 - 1.05 mg/dL Final    Comment:      For patients > or = 59 years of age: The upper reference limit for Creatinine is approximately 13% higher for people identified as African-American.      Creatinine, Ser  Date Value Ref Range Status  11/02/2017 0.84 0.57 - 1.00 mg/dL Final         Passed - HBA1C is between 0 and 7.9 and within 180 days    Hemoglobin A1C  Date Value Ref Range Status  11/02/2017 6.3 (A) 4.0 - 5.6 % Final   Hgb A1c MFr Bld  Date Value Ref Range Status  08/08/2016 6.2 (H) 4.8 - 5.6 % Final    Comment:             Pre-diabetes: 5.7 - 6.4          Diabetes: >6.4          Glycemic control for adults with diabetes: <7.0          Passed - eGFR in normal range and within 360 days    GFR, Est African American  Date Value Ref Range Status  09/25/2015 88 >=60 mL/min Final   GFR calc Af Amer  Date Value Ref Range Status  11/02/2017 88 >59 mL/min/1.73 Final   GFR, Est Non African American  Date Value Ref Range Status  09/25/2015 76 >=60 mL/min Final   GFR calc non Af Amer  Date Value Ref Range Status  11/02/2017 76 >59 mL/min/1.73 Final         Passed - Valid encounter within last 6 months    Recent Outpatient Visits          3 months ago Dizziness   Primary Care at [place], [place] D, PA-C   5 months ago Type 2 diabetes mellitus without complication, without long-term current use of insulin ([place])   Primary Care at [place], [place] D, PA-C   8 months ago Allergic rhinitis, unspecified seasonality, unspecified trigger   Primary  Care at [place], [place] D, PA-C   8 months ago Type 2 diabetes mellitus without complication, without long-term current use of insulin [facility])   [facility], Lilia Argue, MD   9 months ago Osteopenia of lumbar spine   [facility], Lilia Argue, MD

## 2018-03-05 DIAGNOSIS — K080 Exfoliation of teeth due to systemic causes: Secondary | ICD-10-CM | POA: Diagnosis not present

## 2018-04-04 DIAGNOSIS — L600 Ingrowing nail: Secondary | ICD-10-CM | POA: Diagnosis not present

## 2018-04-04 DIAGNOSIS — M25774 Osteophyte, right foot: Secondary | ICD-10-CM | POA: Diagnosis not present

## 2018-05-03 DIAGNOSIS — G5603 Carpal tunnel syndrome, bilateral upper limbs: Secondary | ICD-10-CM | POA: Diagnosis not present

## 2018-05-03 DIAGNOSIS — R634 Abnormal weight loss: Secondary | ICD-10-CM | POA: Diagnosis not present

## 2018-05-03 DIAGNOSIS — M5417 Radiculopathy, lumbosacral region: Secondary | ICD-10-CM | POA: Diagnosis not present

## 2018-05-03 DIAGNOSIS — G603 Idiopathic progressive neuropathy: Secondary | ICD-10-CM | POA: Diagnosis not present

## 2018-05-03 DIAGNOSIS — G609 Hereditary and idiopathic neuropathy, unspecified: Secondary | ICD-10-CM | POA: Diagnosis not present

## 2018-05-03 DIAGNOSIS — R202 Paresthesia of skin: Secondary | ICD-10-CM | POA: Diagnosis not present

## 2018-05-03 DIAGNOSIS — M5412 Radiculopathy, cervical region: Secondary | ICD-10-CM | POA: Diagnosis not present

## 2018-05-03 DIAGNOSIS — G2581 Restless legs syndrome: Secondary | ICD-10-CM | POA: Diagnosis not present

## 2018-05-07 ENCOUNTER — Other Ambulatory Visit: Payer: Self-pay | Admitting: Physician Assistant

## 2018-05-07 DIAGNOSIS — E119 Type 2 diabetes mellitus without complications: Secondary | ICD-10-CM

## 2018-05-07 NOTE — Telephone Encounter (Signed)
Requested medication (s) are due for refill today: yes  Requested medication (s) are on the active medication list: yes  Last refill:  02/08/2018 for 90 tabs  Future visit scheduled: yes  Appointment 05/11/2018  Notes to clinic:  Diabetes - biguanides failed. Failed HBA1C.  Requested Prescriptions  Pending Prescriptions Disp Refills   metFORMIN (GLUCOPHAGE-XR) 500 MG 24 hr tablet [Pharmacy Med Name: METFORMIN HCL ER 500 MG TABLET] 90 tablet 0    Sig: TAKE 1 TABLET BY MOUTH EVERY DAY WITH BREAKFAST     Endocrinology:  Diabetes - Biguanides Failed - 05/07/2018  2:09 AM      Failed - HBA1C is between 0 and 7.9 and within 180 days    Hemoglobin A1C  Date Value Ref Range Status  11/02/2017 6.3 (A) 4.0 - 5.6 % Final   Hgb A1c MFr Bld  Date Value Ref Range Status  08/08/2016 6.2 (H) 4.8 - 5.6 % Final    Comment:             Pre-diabetes: 5.7 - 6.4          Diabetes: >6.4          Glycemic control for adults with diabetes: <7.0          Failed - Valid encounter within last 6 months    Recent Outpatient Visits          6 months ago Dizziness   Primary [facility] at [place], [place] D, PA-C   8 months ago Type 2 diabetes mellitus without complication, without long-term current use of insulin ([place])   Primary Care at [place], [place] D, PA-C   11 months ago Allergic rhinitis, unspecified seasonality, unspecified trigger   Primary Care at [place], [place] D, PA-C   11 months ago Type 2 diabetes mellitus without complication, without long-term current use of insulin [facility])   [facility], Lilia Argue, MD   1 year ago Osteopenia of lumbar spine   [facility], Lilia Argue, MD      Future Appointments            In 4 days Wendie Agreste, MD Primary Care at [place], [place] in normal range and within 360 days    Creat  Date Value Ref Range Status  09/25/2015 0.85 0.50 - 1.05 mg/dL Final    Comment:      For  patients > or = 61 years of age: The upper reference limit for Creatinine is approximately 13% higher for people identified as African-American.      Creatinine, Ser  Date Value Ref Range Status  11/02/2017 0.84 0.57 - 1.00 mg/dL Final         Passed - eGFR in normal range and within 360 days    GFR, Est African American  Date Value Ref Range Status  09/25/2015 88 >=60 mL/min Final   GFR calc Af Amer  Date Value Ref Range Status  11/02/2017 88 >59 mL/min/1.73 Final   GFR, Est Non African American  Date Value Ref Range Status  09/25/2015 76 >=60 mL/min Final   GFR calc non Af Amer  Date Value Ref Range Status  11/02/2017 76 >59 mL/min/1.73 Final

## 2018-05-09 ENCOUNTER — Telehealth: Payer: Self-pay | Admitting: Family Medicine

## 2018-05-09 ENCOUNTER — Other Ambulatory Visit: Payer: Self-pay | Admitting: Family Medicine

## 2018-05-09 ENCOUNTER — Ambulatory Visit: Payer: Self-pay | Admitting: *Deleted

## 2018-05-09 DIAGNOSIS — E119 Type 2 diabetes mellitus without complications: Secondary | ICD-10-CM

## 2018-05-09 MED ORDER — FREESTYLE LIBRE 14 DAY SENSOR MISC: 1.0000 [IU] | 0 refills | Status: DC

## 2018-05-09 NOTE — Telephone Encounter (Signed)
Spoke with chanda and due to protocol patient does not need to be tested and should keep her appointment on Friday.           Questions if she should be tested for [facility]. She has traveled recently but not to any screening locations. Negative for any contact with known [place].She has post-nasal drip with irritated throat and headache yesterday. Denies fever/SOB/cough. Care advice reviewed regarding allergy.Advice regarding staying healthy during the time of uncertainty.Reviewed symptoms requiring immediate evaluation-stated she understood.  Also, reporting she stopped taking metformin ER on 04/20/18 due to low events on the freestyle libre. Since she has stopped taking it, her average cbg for the last 14 days=104. Reports she has lost weight and is exercising, regularly. She wants to keep her appointment on Friday to check her A1c.  Reason for Disposition . Health Information question, no triage required and triager able to answer question  Answer Assessment - Initial Assessment Questions 1. REASON FOR CALL or QUESTION: "What is your reason for calling today?" or "How can I best help you?" or "What question do you have that I can help answer?"     Should I be tested for [facility]?  Protocols used: INFORMATION ONLY CALL-A-AH

## 2018-05-09 NOTE — Telephone Encounter (Signed)
Questions if she should be tested for [facility]. She has traveled recently but not to any screening locations. Negative for any contact with known [place].She has post-nasal drip with irritated throat and headache yesterday. Denies fever/SOB/cough. Care advice reviewed regarding allergy.Advice regarding staying healthy during the time of uncertainty.Reviewed symptoms requiring immediate evaluation-stated she understood.  Also, reporting she stopped taking metformin ER on 04/20/18 due to low events on the freestyle libre. Since she has stopped taking it, her average cbg for the last 14 days=104. Reports she has lost weight and is exercising, regularly. She wants to keep her appointment on Friday to check her A1c.  Reason for Disposition . Health Information question, no triage required and triager able to answer question  Answer Assessment - Initial Assessment Questions 1. REASON FOR CALL or QUESTION: "What is your reason for calling today?" or "How can I best help you?" or "What question do you have that I can help answer?"     Should I be tested for [facility]?  Protocols used: INFORMATION ONLY CALL-A-AH

## 2018-05-09 NOTE — Telephone Encounter (Signed)
Copied from CRM [id number]. Topic: Quick Communication - See Telephone Encounter >> May 09, 2018  8:18 AM Maye Hides wrote: CRM for notification. See Telephone encounter for: 05/09/18.

## 2018-05-09 NOTE — Telephone Encounter (Signed)
During triage call patient requested refill for Continuous Blood Gluc Sensor (Freestyle Libre 14 day Sensor) 1 unit every 14 days. Refilled for 30 days-2 units. She has appointment on 05/11/18 with Dr. Neva Seat that was scheduled on 05/01/18. Approved one order with no refills until established with new provider.

## 2018-05-11 ENCOUNTER — Other Ambulatory Visit: Payer: Self-pay

## 2018-05-11 ENCOUNTER — Ambulatory Visit: Payer: Federal, State, Local not specified - PPO | Admitting: Family Medicine

## 2018-05-11 ENCOUNTER — Encounter: Payer: Self-pay | Admitting: Family Medicine

## 2018-05-11 VITALS — BP 132/72 | HR 101 | Temp 98.2°F | Resp 17 | Ht 62.0 in | Wt 137.0 lb

## 2018-05-11 DIAGNOSIS — R05 Cough: Secondary | ICD-10-CM | POA: Diagnosis not present

## 2018-05-11 DIAGNOSIS — J309 Allergic rhinitis, unspecified: Secondary | ICD-10-CM

## 2018-05-11 DIAGNOSIS — E119 Type 2 diabetes mellitus without complications: Secondary | ICD-10-CM

## 2018-05-11 DIAGNOSIS — E785 Hyperlipidemia, unspecified: Secondary | ICD-10-CM | POA: Diagnosis not present

## 2018-05-11 DIAGNOSIS — R059 Cough, unspecified: Secondary | ICD-10-CM

## 2018-05-11 MED ORDER — FLUTICASONE PROPIONATE 50 MCG/ACT NA SUSP: 2.0000 | Freq: Every day | NASAL | 6 refills | Status: DC

## 2018-05-11 NOTE — Patient Instructions (Addendum)
  Ok to hold the metformin for now. Continue to monitor home readings and if readings are remaining high, then restart metformin once per day.   Keep follow up with neurologist regarding neuropathy as it sounds like it may be coming form your back.   Okay to continue Claritin, I prefer that over Claritin-D.  Saline nasal spray if needed for congestion, but start Flonase as that may also be helpful for allergy symptoms.  As long as you are having symptoms, I would still recommend self isolation, but if allergy symptoms resolve with medication, okay to return to work as long as you have no other symptoms.  Thank you for coming in today, return in 6 months depending on hemoglobin A1c.  Let us know if there are questions in the meantime   If you have lab work done today you will be contacted with your lab results within the next 2 weeks.  If you have not heard from [place] then please contact us. The fastest way to get your results is to register for My Chart.   IF you received an x-ray today, you will receive an invoice from [facility]. Please contact [facility] at [phone] with questions or concerns regarding your invoice.   IF you received labwork today, you will receive an invoice from [place]. Please contact [facility] at [phone] with questions or concerns regarding your invoice.   Our billing staff will not be able to assist you with questions regarding bills from these companies.  You will be contacted with the lab results as soon as they are available. The fastest way to get your results is to activate your My Chart account. Instructions are located on the last page of this paperwork. If you have not heard from [place] regarding the results in 2 weeks, please contact this office.

## 2018-05-11 NOTE — Progress Notes (Signed)
Subjective:    Patient ID: Kelly Fitzpatrick, female    DOB: 06/03/1960, 58 y.o.   MRN: [id number]  Kelly Fitzpatrick is a 58 y.o. female Presents today for: Chief Complaint  Patient presents with  . Follow-up    diabetes   . Cough   Diabetes:  On metformin 500 mg daily, but stopped 2/28. Readings were running low - 70's at times, and upper 50-60 at times. Since stopping metformin - no lows. Readings off metformin - 98, 89,86, 103, 191 (drank a glass of milk). Not sure if variability in readings.  Exercising - has noticed improved readings.  Some tingling in both feet started last month. Sensation moving up R foot at times. Saw Dr. Elijah Birk for sore toenail. Saw neurologist in [place] week ago - was told had neuropathy. Did not specify cause, but has back issues, had needle testing - plan on injection on back in April.  on lisinopril for ACE inhibitor, pravastatin for hyperlipidemia. Microalbumin: Normal in June 2019 Optho, foot exam, pneumovax: Up-to-date, with ophthalmology ordered last June - did have appt, no known retinopathy.   Lab Results  Component Value Date   HGBA1C 6.3 (A) 11/02/2017   HGBA1C 6.3 (A) 08/18/2017   HGBA1C 7.6 05/15/2017   Lab Results  Component Value Date   MICROALBUR 0.5 06/29/2015   LDLCALC 76 11/02/2017   CREATININE 0.84 11/02/2017    Cough: Tickle in throat, PND. Few sneezes.  Dry cough  - irritant.  No fever. No bodyache.  Occasional HA.  Started 2 days ago.  Typical does get allergies this time of year - similar sx's.  Tx: claritin D - helping.  No known sick contacts. On cruise to [place] 2/28-3/7.  hasnot been advised of any infected individuals on that cruise. No known exposure to coronavirus infected person.     Review of Systems  Constitutional: Negative for fatigue and unexpected weight change.  Respiratory: Negative for chest tightness and shortness of breath.   Cardiovascular: Negative for chest pain, palpitations  and leg swelling.  Gastrointestinal: Negative for abdominal pain and blood in stool.  Neurological: Negative for dizziness, syncope, light-headedness and headaches.       Objective:   Physical Exam Vitals signs reviewed.  Constitutional:      Appearance: She is well-developed.  HENT:     Head: Normocephalic and atraumatic.  Eyes:     Conjunctiva/sclera: Conjunctivae normal.     Pupils: Pupils are equal, round, and reactive to light.  Neck:     Vascular: No carotid bruit.  Cardiovascular:     Rate and Rhythm: Normal rate and regular rhythm.     Heart sounds: Normal heart sounds.  Pulmonary:     Effort: Pulmonary effort is normal.     Breath sounds: Normal breath sounds.  Abdominal:     Palpations: Abdomen is soft. There is no pulsatile mass.     Tenderness: There is no abdominal tenderness.  Skin:    General: Skin is warm and dry.  Neurological:     Mental Status: She is alert and oriented to person, place, and time.  Psychiatric:        Behavior: Behavior normal.    Vitals:   05/11/18 0819  BP: 132/72  Pulse: (!) 101  Resp: 17  Temp: 98.2 F (36.8 C)  SpO2: 98%     Results for orders placed or performed in visit on 05/11/18  Lipid panel  Result Value Ref Range  Cholesterol, Total 151 100 - 199 mg/dL   Triglycerides 43 0 - 149 mg/dL   HDL 55 >73 mg/dL   VLDL Cholesterol Cal 9 5 - 40 mg/dL   LDL Calculated 87 0 - 99 mg/dL   Chol/HDL Ratio 2.7 0.0 - 4.4 ratio  Comprehensive metabolic panel  Result Value Ref Range   Glucose 141 (H) 65 - 99 mg/dL   BUN 14 8 - 27 mg/dL   Creatinine, Ser 2.20 0.57 - 1.00 mg/dL   GFR calc non Af Amer 73 >59 mL/min/1.73   GFR calc Af Amer 84 >59 mL/min/1.73   BUN/Creatinine Ratio 16 12 - 28   Sodium 137 134 - 144 mmol/L   Potassium 4.5 3.5 - 5.2 mmol/L   Chloride 101 96 - 106 mmol/L   CO2 21 20 - 29 mmol/L   Calcium 10.0 8.7 - 10.3 mg/dL   Total Protein 6.8 6.0 - 8.5 g/dL   Albumin 4.5 3.8 - 4.9 g/dL   Globulin, Total 2.3  1.5 - 4.5 g/dL   Albumin/Globulin Ratio 2.0 1.2 - 2.2   Bilirubin Total 0.4 0.0 - 1.2 mg/dL   Alkaline Phosphatase 70 39 - 117 IU/L   AST 15 0 - 40 IU/L   ALT 12 0 - 32 IU/L  Hemoglobin A1c  Result Value Ref Range   Hgb A1c MFr Bld 6.3 (H) 4.8 - 5.6 %   Est. average glucose Bld gHb Est-mCnc 134 mg/dL        Assessment & Plan:   Kelly Fitzpatrick is a 58 y.o. female Type 2 diabetes mellitus without complication, without long-term current use of insulin (HCC) - Plan: Hemoglobin A1c  -Okay to continue holding metformin for now based on lower readings at home.  Monitor home readings and if increases are noted, may need to restart low-dose metformin once daily.   -Stable based on A1c.  -Neuropathic symptoms may be related to lumbar spine.  Has follow-up planned with neurologist.  Hyperlipidemia, unspecified hyperlipidemia type - Plan: Lipid panel, Comprehensive metabolic panel  -Overall stable, continue pravastatin.  Allergic rhinitis, unspecified seasonality, unspecified trigger - Plan: fluticasone (FLONASE) 50 MCG/ACT nasal spray Cough  Flonase for allergies, correct technique discussed.  Claritin preferred over Claritin-D given history of diabetes.  Saline nasal spray, other symptomatic care discussed, okay to remain at home until symptoms have improved.  RTC precautions discussed.  Meds ordered this encounter  Medications  . fluticasone (FLONASE) 50 MCG/ACT nasal spray    Sig: Place 2 sprays into both nostrils daily.    Dispense:  16 g    Refill:  6   Patient Instructions    Ok to hold the metformin for now. Continue to monitor home readings and if readings are remaining high, then restart metformin once per day.   Keep follow up with neurologist regarding neuropathy as it sounds like it may be coming form your back.   Okay to continue Claritin, I prefer that over Claritin-D.  Saline nasal spray if needed for congestion, but start Flonase as that may also be helpful for  allergy symptoms.  As long as you are having symptoms, I would still recommend self isolation, but if allergy symptoms resolve with medication, okay to return to work as long as you have no other symptoms.  Thank you for coming in today, return in 6 months depending on hemoglobin A1c.  Let us know if there are questions in the meantime   If you have lab work done today  you will be contacted with your lab results within the next 2 weeks.  If you have not heard from [place] then please contact us. The fastest way to get your results is to register for My Chart.   IF you received an x-ray today, you will receive an invoice from [facility]. Please contact [facility] at [phone] with questions or concerns regarding your invoice.   IF you received labwork today, you will receive an invoice from [place]. Please contact [facility] at [phone] with questions or concerns regarding your invoice.   Our billing staff will not be able to assist you with questions regarding bills from these companies.  You will be contacted with the lab results as soon as they are available. The fastest way to get your results is to activate your My Chart account. Instructions are located on the last page of this paperwork. If you have not heard from [place] regarding the results in 2 weeks, please contact this office.       Signed,   Meredith Staggers, MD Primary Care at [facility].  05/13/18 1:56 PM

## 2018-05-12 LAB — COMPREHENSIVE METABOLIC PANEL
ALT: 12 IU/L (ref 0–32)
AST: 15 IU/L (ref 0–40)
Albumin/Globulin Ratio: 2 (ref 1.2–2.2)
Albumin: 4.5 g/dL (ref 3.8–4.9)
Alkaline Phosphatase: 70 IU/L (ref 39–117)
BUN/Creatinine Ratio: 16 (ref 12–28)
BUN: 14 mg/dL (ref 8–27)
Bilirubin Total: 0.4 mg/dL (ref 0.0–1.2)
CO2: 21 mmol/L (ref 20–29)
Calcium: 10 mg/dL (ref 8.7–10.3)
Chloride: 101 mmol/L (ref 96–106)
Creatinine, Ser: 0.87 mg/dL (ref 0.57–1.00)
GFR calc Af Amer: 84 mL/min/{1.73_m2} (ref >59)
GFR calc non Af Amer: 73 mL/min/{1.73_m2} (ref >59)
Globulin, Total: 2.3 g/dL (ref 1.5–4.5)
Glucose: 141 mg/dL — ABNORMAL HIGH (ref 65–99)
Potassium: 4.5 mmol/L (ref 3.5–5.2)
Sodium: 137 mmol/L (ref 134–144)
Total Protein: 6.8 g/dL (ref 6.0–8.5)

## 2018-05-12 LAB — LIPID PANEL
Chol/HDL Ratio: 2.7 ratio (ref 0.0–4.4)
Cholesterol, Total: 151 mg/dL (ref 100–199)
HDL: 55 mg/dL (ref >39)
LDL Calculated: 87 mg/dL (ref 0–99)
Triglycerides: 43 mg/dL (ref 0–149)
VLDL Cholesterol Cal: 9 mg/dL (ref 5–40)

## 2018-05-12 LAB — HEMOGLOBIN A1C
Est. average glucose Bld gHb Est-mCnc: 134 mg/dL
Hgb A1c MFr Bld: 6.3 % — ABNORMAL HIGH (ref 4.8–5.6)

## 2018-05-13 ENCOUNTER — Encounter: Payer: Self-pay | Admitting: Family Medicine

## 2018-05-22 ENCOUNTER — Telehealth (INDEPENDENT_AMBULATORY_CARE_PROVIDER_SITE_OTHER): Payer: Federal, State, Local not specified - PPO | Admitting: Family Medicine

## 2018-05-22 ENCOUNTER — Other Ambulatory Visit: Payer: Self-pay

## 2018-05-22 DIAGNOSIS — E1140 Type 2 diabetes mellitus with diabetic neuropathy, unspecified: Secondary | ICD-10-CM | POA: Diagnosis not present

## 2018-05-22 DIAGNOSIS — J301 Allergic rhinitis due to pollen: Secondary | ICD-10-CM

## 2018-05-22 NOTE — Progress Notes (Signed)
CC: diabetic check.  Traveled outside the country to [place] on 2/29 thru 04/28/2018.

## 2018-05-22 NOTE — Patient Instructions (Signed)
° ° ° °  If you have lab work done today you will be contacted with your lab results within the next 2 weeks.  If you have not heard from us then please contact us. The fastest way to get your results is to register for My Chart. ° ° °IF you received an x-ray today, you will receive an invoice from [facility]. Please contact [facility] at [phone] with questions or concerns regarding your invoice.  ° °IF you received labwork today, you will receive an invoice from [facility]. Please contact [facility] at [phone] with questions or concerns regarding your invoice.  ° °Our billing staff will not be able to assist you with questions regarding bills from these companies. ° °You will be contacted with the lab results as soon as they are available. The fastest way to get your results is to activate your My Chart account. Instructions are located on the last page of this paperwork. If you have not heard from us regarding the results in 2 weeks, please contact this office. °  ° ° ° °

## 2018-05-22 NOTE — Progress Notes (Signed)
Telemedicine Encounter- SOAP NOTE Established Patient  This telephone encounter was conducted with the patient's (or proxy's) verbal consent via audio telecommunications: yes/no: Yes Patient was instructed to have this encounter in a suitably private space; and to only have persons present to whom they give permission to participate. In addition, patient identity was confirmed by use of name plus two identifiers (DOB and address).  I discussed the limitations, risks, security and privacy concerns of performing an evaluation and management service by telephone and the availability of in person appointments. I also discussed with the patient that there may be a patient responsible charge related to this service. The patient expressed understanding and agreed to proceed.  I spent a total of TIME; 0 MIN TO 60 MIN: 20 minutes talking with the patient or their proxy.  CC: diabetes   Subjective   Kelly Fitzpatrick is a 58 y.o. established patient. Telephone visit today for  HPI  Diabetes Mellitus: Patient presents for follow up of diabetes. Symptoms: hypoglycemia . Symptoms have stabilized. Patient denies hyperglycemia, increase appetite, paresthesia of the feet and visual disturbances.  Evaluation to date has been included: hemoglobin A1C.  Home sugars: BGs consistently in an acceptable range.   Lab Results  Component Value Date   HGBA1C 6.3 (H) 05/11/2018   Her glucose was 78 on freestyle libre and on finger stick it was 93 She states that she might lose some weight and is wondering if she is getting enough calories.  Wt Readings from Last 3 Encounters:  05/11/18 137 lb (62.1 kg)  11/02/17 130 lb 9.6 oz (59.2 kg)  08/18/17 135 lb 6.4 oz (61.4 kg)    BP Readings from Last 3 Encounters:  05/11/18 132/72  11/02/17 110/62  08/18/17 124/72    Allergic Rhinitis: Kelly Fitzpatrick is here for evaluation of possible allergic rhinitis. She uses flonase. She gets congestion from mold and dust.  She has not been wearing a mask or covering. Symptoms are worse this spring.  No history of asthma or smoking.  She states that she has been dealing with this in the fall and the spring.     Patient Active Problem List   Diagnosis Date Noted  . Hyperlipidemia 06/20/2012  . Diabetes (HCC) 06/20/2012    Past Medical History:  Diagnosis Date  . Allergy   . Anemia   . Diabetes mellitus   . Endometriosis   . Hypercholesteremia   . Sickle cell trait (HCC)     Current Outpatient Medications  Medication Sig Dispense Refill  . Blood Pressure Monitor DEVI 1 Units by Does not apply route daily as needed. 1 Device 0  . calcium gluconate 500 MG tablet Take 500 mg by mouth daily. Reported on 06/29/2015    . cholecalciferol (VITAMIN D) 400 UNITS TABS Take 400 Units by mouth daily.    . Continuous Blood Gluc Receiver (FREESTYLE LIBRE 14 DAY READER) DEVI 1 Units by Does not apply route daily as needed. 1 Device 0  . Continuous Blood Gluc Sensor (FREESTYLE LIBRE 14 DAY SENSOR) MISC 1 Units by Does not apply route every 14 (fourteen) days. 2 each 0  . fluticasone (FLONASE) 50 MCG/ACT nasal spray Place 2 sprays into both nostrils daily. 16 g 6  . Garlic 10 MG CAPS Take 1 tablet by mouth daily.    Marland Kitchen glucose blood (FREESTYLE PRECISION NEO TEST) test strip Use as instructed 50 each 2  . Lancets MISC To check blood sugar daily dx code  E11.65 100 each 0  . lisinopril (PRINIVIL,ZESTRIL) 5 MG tablet Take 1 tablet (5 mg total) by mouth daily. 90 tablet 3  . loratadine-pseudoephedrine (CLARITIN-D 12-HOUR) 5-120 MG per tablet Take 1 tablet by mouth 2 (two) times daily as needed. Reported on 06/29/2015    . ONE TOUCH ULTRA TEST test strip 1 EACH BY OTHER ROUTE DAILY. DX E11.65 100 each 0  . pravastatin (PRAVACHOL) 40 MG tablet Take 1 tablet (40 mg total) by mouth daily. 90 tablet 3  . aspirin 81 MG chewable tablet Chew 81 mg by mouth daily.    . Calcium Carbonate (CALCIUM 600 PO) Take by mouth.    . metFORMIN  (GLUCOPHAGE-XR) 500 MG 24 hr tablet TAKE 1 TABLET BY MOUTH EVERY DAY WITH BREAKFAST (Patient not taking: Reported on 05/11/2018) 90 tablet 0   No current facility-administered medications for this visit.     Allergies  Allergen Reactions  . Cephalexin Other (See Comments)    unknown  . Amoxicillin Rash  . Hyoscyamine Sulfate Rash  . Sulfa Antibiotics Rash    Social History   Socioeconomic History  . Marital status: Single    Spouse name: Not on file  . Number of children: 0  . Years of education: Not on file  . Highest education level: Not on file  Occupational History  . Not on file  Social Needs  . Financial resource strain: Not on file  . Food insecurity:    Worry: Not on file    Inability: Not on file  . Transportation needs:    Medical: Not on file    Non-medical: Not on file  Tobacco Use  . Smoking status: Never Smoker  . Smokeless tobacco: Never Used  Substance and Sexual Activity  . Alcohol use: No  . Drug use: No  . Sexual activity: Not Currently    Birth control/protection: Abstinence  Lifestyle  . Physical activity:    Days per week: Not on file    Minutes per session: Not on file  . Stress: Not on file  Relationships  . Social connections:    Talks on phone: Not on file    Gets together: Not on file    Attends religious service: Not on file    Active member of club or organization: Not on file    Attends meetings of clubs or organizations: Not on file    Relationship status: Not on file  . Intimate partner violence:    Fear of current or ex partner: Not on file    Emotionally abused: Not on file    Physically abused: Not on file    Forced sexual activity: Not on file  Other Topics Concern  . Not on file  Social History Narrative   She is from [place], [place]. Lived in [place] for 29 years. 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 Single, no children. Not currently sexually active.     ROS Review of Systems  Constitutional: Negative  for activity change, appetite change, chills and fever.  HENT: Negative for congestion, nosebleeds, trouble swallowing and voice change.   Respiratory: Negative for cough, shortness of breath and wheezing.   Gastrointestinal: Negative for diarrhea, nausea and vomiting.  Genitourinary: Negative for difficulty urinating, dysuria, flank pain and hematuria.  Musculoskeletal: Negative for back pain, joint swelling and neck pain.  Neurological: Negative for dizziness, speech difficulty, light-headedness and numbness.  See HPI. All other review of systems negative.   Objective   Vitals as reported by  the patient: There were no vitals filed for this visit. 135/79 108/72  There are no diagnoses linked to this encounter.   Type 2 Diabetes Developing hypoglycemia  Discussed that without metformin her numbers are still very well controlled Discussed that she should do protein rich diets Discussed diabetes goals and signs and symptoms of hypoglycemia She is on 2.5mg  of lisinopril for renal protection  Allergic rhinitis  Continue flonase    I discussed the assessment and treatment plan with the patient. The patient was provided an opportunity to ask questions and all were answered. The patient agreed with the plan and demonstrated an understanding of the instructions.   The patient was advised to call back or seek an in-person evaluation if the symptoms worsen or if the condition fails to improve as anticipated.  I provided 20 minutes of non-face-to-face time during this encounter.  Doristine Bosworth, MD  Primary Care at [facility]

## 2018-05-24 ENCOUNTER — Encounter: Payer: Self-pay | Admitting: Radiology

## 2018-05-26 ENCOUNTER — Other Ambulatory Visit: Payer: Self-pay | Admitting: Family Medicine

## 2018-05-26 DIAGNOSIS — E785 Hyperlipidemia, unspecified: Secondary | ICD-10-CM

## 2018-05-31 DIAGNOSIS — M5417 Radiculopathy, lumbosacral region: Secondary | ICD-10-CM | POA: Diagnosis not present

## 2018-05-31 DIAGNOSIS — G5603 Carpal tunnel syndrome, bilateral upper limbs: Secondary | ICD-10-CM | POA: Diagnosis not present

## 2018-05-31 DIAGNOSIS — G6289 Other specified polyneuropathies: Secondary | ICD-10-CM | POA: Diagnosis not present

## 2018-05-31 DIAGNOSIS — G2581 Restless legs syndrome: Secondary | ICD-10-CM | POA: Diagnosis not present

## 2018-06-01 ENCOUNTER — Ambulatory Visit: Payer: Self-pay | Admitting: *Deleted

## 2018-06-01 NOTE — Telephone Encounter (Signed)
Message from Kelly Fitzpatrick sent at 06/01/2018 5:36 PM EDT   Summary: glucose    Pt is calling about her glucose. She had cortisone shots in her back and yesterday and her sugar was 284. Today her sugar is 130. She is wanting to know if her increased glucose is relational to the shots? Please call           Pt calling to ask if recent cortisone shots that were received on yesterday by her neurologist could cause the increase in her blood glucose. Pt states that she received 4 cortisone shots in her lower back and 4 in her upper back towards her neck. Pt states that when she checked her blood glucose on yesterday she noted that her blood sugar was 283 and at other times during the day blood sugar was noted to be 280, 264, and 233. Pt states that her blood glucose usually runs in the 80s-90s but with eating it may increase to the 180s. Pt states she was previously on Metformin XR but has not taken the medication since Feb 28 the due to good glucose control. Pt states that due to increased blood glucose on yesterday pt did take Metformin 500 mg XR per recommendaiton of Dr. Neva Seat on 05/11/18 in which it was noted for the pt to restart Metformin if blood glucose became high again. Last telemedicine visit with Dr. Creta Levin on 05/22/18. Pt states the only symptoms she has at this time is a headache. Pt advised that steroids could cause an increase in glucose readings. Explained to pt that she should continue to monitor glucose readings and if she nootd that her glucose was increasing above 300 and she started to develop other symptoms to return call to office. Pt also asking if she would be considered high risk to contract COVID due to having a history of DM and per last lab draw pt was told that she was prediabetic. Informed pt that at this time people considered to be high risk would include pt's with a history of DM, kidney failure, sickle cell anemia, lund disease and anyone with a weakened immune system. Pt  verbalized understanding.Pt advised that information would be forwarded to provider for review. Pt can be contacted at [phone] with recommendations on taking Metformin XR 500 mg to control blood sugar. Pt states that she does not use her email address.   Reason for Disposition . Blood glucose 70-240 mg/dL (3.9 -59.1 mmol/L)  Answer Assessment - Initial Assessment Questions 1. BLOOD GLUCOSE: "What is your blood glucose level?"     283 on yesterday 2. ONSET: "When did you check the blood glucose?"    Multiple times on yesterday and was noted to be 280 via fingerstick, 233 and 264 3. USUAL RANGE: "What is your glucose level usually?" (e.g., usual fasting morning value, usual evening value)     80s-90s and with eating 180 Pt sates yesterday blood glucose levels have been ranging from 160s-180s 5. TYPE 1 or 2:  "Do you know what type of diabetes you have?"  (e.g., Type 1, Type 2, Gestational; doesn't know)      Type I 6. INSULIN: "Do you take insulin?" "What type of insulin(s) do you use? What is the mode of delivery? (syringe, pen (e.g., injection or  pump)?"      No 7. DIABETES PILLS: "Do you take any pills for your diabetes?" If yes, ask: "Have you missed taking any pills recently?"     No but pt was  advised by Dr Neva SeatGreene during visit on 05/11/18 that if blood glucose started to get High again to restart taking Metformin 8. OTHER SYMPTOMS: "Do you have any symptoms?" (e.g., fever, frequent urination, difficulty breathing, dizziness, weakness, vomiting)     headache  Answer Assessment - Initial Assessment Questions 1. CLOSE CONTACT: "Who is the person with the confirmed or suspected COVID-19 infection that you were exposed to?"     Not exposure 2. PLACE of CONTACT: "Where were you when you were exposed to COVID-19?" (e.g., home, school, medical waiting room; which city?)     N/a 3. TYPE of CONTACT: "How much contact was there?" (e.g., sitting next to, live in same house, work in same  office, same building)     n/a 4. DURATION of CONTACT: "How long were you in contact with the COVID-19 patient?" (e.g., a few seconds, passed by person, a few minutes, live with the patient)     n/a 5. DATE of CONTACT: "When did you have contact with a COVID-19 patient?" (e.g., how many days ago)     n/a 6. TRAVEL: "Have you traveled out of the country recently?" If so, "When and where?"     * Also ask about out-of-state travel, since the CDC has identified some high risk cities for community spread in the [place].     * Note: Travel becomes less relevant if there is widespread community transmission where the patient lives.     n/a 7. COMMUNITY SPREAD: "Are there lots of cases or COVID-19 (community spread) where you live?" (See public health department website, if unsure)   * MAJOR community spread: high number of cases; numbers of cases are increasing; many people hospitalized.   * MINOR community spread: low number of cases; not increasing; few or no people hospitalized     n/a 8. SYMPTOMS: "Do you have any symptoms?" (e.g., fever, cough, breathing difficulty)     n/a 9. PREGNANCY OR POSTPARTUM: "Is there any chance you are pregnant?" "When was your last menstrual period?" "Did you deliver in the last 2 weeks?"     n/a 10. HIGH RISK: "Do you have any heart or lung problems? Do you have a weak immune system?" (e.g., CHF, COPD, asthma, HIV positive, chemotherapy, renal failure, diabetes mellitus, sickle cell anemia)       DM  Protocols used: DIABETES - HIGH BLOOD SUGAR-A-AH, CORONAVIRUS (COVID-19) EXPOSURE-A-AH

## 2018-06-05 NOTE — Telephone Encounter (Signed)
Please advise not sure if a cortisone shot would make their glucose go up. Thank you

## 2018-06-06 ENCOUNTER — Telehealth: Payer: Self-pay | Admitting: Family Medicine

## 2018-06-06 NOTE — Telephone Encounter (Signed)
Copied from CRM [id number]. Topic: General - Inquiry >> Jun 06, 2018 11:31 AM Lynne Logan D wrote: Reason for CRM: Pt stated Dr. Neva Seat put her out of work this week due to her allergies and stated she could return once they returned to normal. Pt's employer needs a letter from her PCP stating she is okay to return to work. Pt would like to know if Dr. Neva Seat can craft a letter stating she is okay to return to work on Monday 06/11/18. Please advise.

## 2018-06-08 ENCOUNTER — Other Ambulatory Visit: Payer: Self-pay | Admitting: Family Medicine

## 2018-06-08 DIAGNOSIS — E119 Type 2 diabetes mellitus without complications: Secondary | ICD-10-CM

## 2018-06-08 NOTE — Telephone Encounter (Signed)
The letter was already drafted placed in box for pt to be picked up. I have attempted to call pt to let her know. No answer so left message to call back if there is any additional questions.

## 2018-06-27 ENCOUNTER — Telehealth: Payer: Self-pay | Admitting: Family Medicine

## 2018-07-15 ENCOUNTER — Other Ambulatory Visit: Payer: Self-pay | Admitting: Family Medicine

## 2018-07-15 DIAGNOSIS — E119 Type 2 diabetes mellitus without complications: Secondary | ICD-10-CM

## 2018-07-15 NOTE — Telephone Encounter (Signed)
Requested medication (s) are due for refill today: Yes  Requested medication (s) are on the active medication list: Yes  Last refill:  05/15/17  Future visit scheduled: No  Notes to clinic:  Prescription has expired.    Requested Prescriptions  Pending Prescriptions Disp Refills   lisinopril (ZESTRIL) 5 MG tablet [Pharmacy Med Name: LISINOPRIL 5 MG TABLET] 90 tablet 3    Sig: TAKE 1 TABLET BY MOUTH EVERY DAY     Cardiovascular:  ACE Inhibitors Passed - 07/15/2018  9:51 AM      Passed - Cr in normal range and within 180 days    Creat  Date Value Ref Range Status  09/25/2015 0.85 0.50 - 1.05 mg/dL Final    Comment:      For patients > or = 60 years of age: The upper reference limit for Creatinine is approximately 13% higher for people identified as African-American.      Creatinine, Ser  Date Value Ref Range Status  05/11/2018 0.87 0.57 - 1.00 mg/dL Final         Passed - K in normal range and within 180 days    Potassium  Date Value Ref Range Status  05/11/2018 4.5 3.5 - 5.2 mmol/L Final         Passed - Patient is not pregnant      Passed - Last BP in normal range    BP Readings from Last 1 Encounters:  05/11/18 132/72         Passed - Valid encounter within last 6 months    Recent Outpatient Visits          2 months ago Type 2 diabetes mellitus without complication, without long-term current use of insulin (HCC)   [facility] at [place], Asencion Partridge, MD   8 months ago Dizziness   Primary Care at [place], [place] D, PA-C   11 months ago Type 2 diabetes mellitus without complication, without long-term current use of insulin [facility])   Primary Care at [place], [place] D, PA-C   1 year ago Allergic rhinitis, unspecified seasonality, unspecified trigger   Primary Care at [place], [place] D, PA-C   1 year ago Type 2 diabetes mellitus without complication, without long-term current use of insulin [facility])   [facility] at [place],  Meda Coffee, MD

## 2018-07-17 ENCOUNTER — Other Ambulatory Visit: Payer: Self-pay | Admitting: Family Medicine

## 2018-07-17 DIAGNOSIS — E119 Type 2 diabetes mellitus without complications: Secondary | ICD-10-CM

## 2018-07-31 NOTE — Telephone Encounter (Signed)
Cancelled appointment

## 2018-07-31 NOTE — Telephone Encounter (Signed)
Saw [facility] on 05/22/2018

## 2018-08-02 DIAGNOSIS — H0102B Squamous blepharitis left eye, upper and lower eyelids: Secondary | ICD-10-CM | POA: Diagnosis not present

## 2018-08-02 DIAGNOSIS — H5213 Myopia, bilateral: Secondary | ICD-10-CM | POA: Diagnosis not present

## 2018-08-02 DIAGNOSIS — H0102A Squamous blepharitis right eye, upper and lower eyelids: Secondary | ICD-10-CM | POA: Diagnosis not present

## 2018-08-02 DIAGNOSIS — E119 Type 2 diabetes mellitus without complications: Secondary | ICD-10-CM | POA: Diagnosis not present

## 2018-08-02 DIAGNOSIS — H524 Presbyopia: Secondary | ICD-10-CM | POA: Diagnosis not present

## 2018-08-02 DIAGNOSIS — H52203 Unspecified astigmatism, bilateral: Secondary | ICD-10-CM | POA: Diagnosis not present

## 2018-08-02 DIAGNOSIS — H25813 Combined forms of age-related cataract, bilateral: Secondary | ICD-10-CM | POA: Diagnosis not present

## 2018-08-02 LAB — HM DIABETES EYE EXAM

## 2018-08-06 ENCOUNTER — Encounter: Payer: Self-pay | Admitting: *Deleted

## 2018-08-10 ENCOUNTER — Other Ambulatory Visit: Payer: Self-pay | Admitting: Physician Assistant

## 2018-08-10 DIAGNOSIS — E119 Type 2 diabetes mellitus without complications: Secondary | ICD-10-CM

## 2018-08-10 NOTE — Telephone Encounter (Signed)
Forwarding medication refill to PCP for review. 

## 2018-10-07 ENCOUNTER — Other Ambulatory Visit: Payer: Self-pay | Admitting: Family Medicine

## 2018-10-07 DIAGNOSIS — E119 Type 2 diabetes mellitus without complications: Secondary | ICD-10-CM

## 2018-10-08 NOTE — Telephone Encounter (Signed)
Requested Prescriptions  Pending Prescriptions Disp Refills  . Continuous Blood Gluc Sensor (FREESTYLE LIBRE 14 DAY SENSOR) MISC [Pharmacy Med Name: FREESTYLE LIBRE 14 DAY SENSOR] 2 each 2    Sig: APPLY AS DIRECTED EVERY 14 DAYS     There is no refill protocol information for this order

## 2018-11-07 ENCOUNTER — Other Ambulatory Visit: Payer: Self-pay | Admitting: Family Medicine

## 2018-11-07 DIAGNOSIS — J309 Allergic rhinitis, unspecified: Secondary | ICD-10-CM

## 2018-11-07 NOTE — Telephone Encounter (Signed)
Requested medication (s) are due for refill today: yes  Requested medication (s) are on the active medication list: yes  Last refill:  08/06/2018  Future visit scheduled: no  Notes to clinic:  Ordering provider is different than pcp Review for refill   Requested Prescriptions  Pending Prescriptions Disp Refills   fluticasone (FLONASE) 50 MCG/ACT nasal spray [Pharmacy Med Name: FLUTICASONE PROP 50 MCG SPRAY] 48 mL 2    Sig: SPRAY 2 SPRAYS INTO EACH NOSTRIL EVERY DAY     Ear, Nose, and Throat: Nasal Preparations - Corticosteroids Passed - 11/07/2018  2:09 AM      Passed - Valid encounter within last 12 months    Recent Outpatient Visits          5 months ago Controlled type 2 diabetes mellitus with neuropathy ([place])   Primary Care at [place], MD   6 months ago Type 2 diabetes mellitus without complication, without long-term current use of insulin [facility])   [facility] at [place], Ranell Patrick, MD   1 year ago Dizziness   Primary Care at [place], [place] D, PA-C   1 year ago Type 2 diabetes mellitus without complication, without long-term current use of insulin [facility])   Primary Care at [place], [place] D, PA-C   1 year ago Allergic rhinitis, unspecified seasonality, unspecified trigger   Primary Care at [facility], [place] D, PA-C

## 2018-11-12 ENCOUNTER — Ambulatory Visit: Payer: Federal, State, Local not specified - PPO | Admitting: Family Medicine

## 2019-02-05 ENCOUNTER — Other Ambulatory Visit: Payer: Self-pay | Admitting: Family Medicine

## 2019-02-05 DIAGNOSIS — E119 Type 2 diabetes mellitus without complications: Secondary | ICD-10-CM

## 2019-02-05 NOTE — Telephone Encounter (Signed)
No further refills without office visit 

## 2019-02-05 NOTE — Telephone Encounter (Signed)
Requested medication (s) are due for refill today: yes  Requested medication (s) are on the active medication list: yes  Last refill:  11/14/2018  Future visit scheduled: no  Notes to clinic:  review for refill Overdue for office visit    Requested Prescriptions  Pending Prescriptions Disp Refills   metFORMIN (GLUCOPHAGE-XR) 500 MG 24 hr tablet [Pharmacy Med Name: METFORMIN HCL ER 500 MG TABLET] 90 tablet 1    Sig: TAKE 1 TABLET BY [place]      Endocrinology:  Diabetes - Biguanides Failed - 02/05/2019  1:27 AM      Failed - HBA1C is between 0 and 7.9 and within 180 days    Hgb A1c MFr Bld  Date Value Ref Range Status  05/11/2018 6.3 (H) 4.8 - 5.6 % Final    Comment:             Prediabetes: 5.7 - 6.4          Diabetes: >6.4          Glycemic control for adults with diabetes: <7.0           Failed - Valid encounter within last 6 months    Recent Outpatient Visits           8 months ago Controlled type 2 diabetes mellitus with neuropathy ([place])   Primary Care at [facility], Zoe A, MD   9 months ago Type 2 diabetes mellitus without complication, without long-term current use of insulin [facility])   [facility] at [place], Ranell Patrick, MD   1 year ago Dizziness   Primary Care at [place], [place] D, PA-C   1 year ago Type 2 diabetes mellitus without complication, without long-term current use of insulin [facility])   Primary Care at [place], [place] D, PA-C   1 year ago Allergic rhinitis, unspecified seasonality, unspecified trigger   Primary Care at [place], [place] D, PA-C              Passed - Cr in normal range and within 360 days    Creat  Date Value Ref Range Status  09/25/2015 0.85 0.50 - 1.05 mg/dL Final    Comment:      For patients > or = 60 years of age: The upper reference limit for Creatinine is approximately 13% higher for people identified as African-American.      Creatinine, Ser  Date Value Ref  Range Status  05/11/2018 0.87 0.57 - 1.00 mg/dL Final          Passed - eGFR in normal range and within 360 days    GFR, Est African American  Date Value Ref Range Status  09/25/2015 88 >=60 mL/min Final   GFR calc Af Amer  Date Value Ref Range Status  05/11/2018 84 >59 mL/min/1.73 Final   GFR, Est Non African American  Date Value Ref Range Status  09/25/2015 76 >=60 mL/min Final   GFR calc non Af Amer  Date Value Ref Range Status  05/11/2018 73 >59 mL/min/1.73 Final

## 2019-02-28 ENCOUNTER — Other Ambulatory Visit: Payer: Self-pay | Admitting: Family Medicine

## 2019-02-28 DIAGNOSIS — E119 Type 2 diabetes mellitus without complications: Secondary | ICD-10-CM

## 2019-03-07 DIAGNOSIS — Z20822 Contact with and (suspected) exposure to covid-19: Secondary | ICD-10-CM | POA: Diagnosis not present

## 2019-03-14 ENCOUNTER — Other Ambulatory Visit: Payer: Self-pay | Admitting: Family Medicine

## 2019-03-14 DIAGNOSIS — E119 Type 2 diabetes mellitus without complications: Secondary | ICD-10-CM

## 2019-03-14 NOTE — Telephone Encounter (Signed)
Requested medication (s) are due for refill today: yes  Requested medication (s) are on the active medication list: yes  Last refill:  02/28/19  #30  0 refills  Future visit scheduled: No   Notes to clinic:  last OV 05/22/18  needs appointment and labs    Requested Prescriptions  Pending Prescriptions Disp Refills   metFORMIN (GLUCOPHAGE-XR) 500 MG 24 hr tablet [Pharmacy Med Name: METFORMIN HCL ER 500 MG TABLET] 90 tablet 1    Sig: TAKE 1 TABLET BY [place]      Endocrinology:  Diabetes - Biguanides Failed - 03/14/2019  9:22 AM      Failed - HBA1C is between 0 and 7.9 and within 180 days    Hgb A1c MFr Bld  Date Value Ref Range Status  05/11/2018 6.3 (H) 4.8 - 5.6 % Final    Comment:             Prediabetes: 5.7 - 6.4          Diabetes: >6.4          Glycemic control for adults with diabetes: <7.0           Failed - Valid encounter within last 6 months    Recent Outpatient Visits           9 months ago Controlled type 2 diabetes mellitus with neuropathy ([place])   Primary Care at [facility], [place], MD   10 months ago Type 2 diabetes mellitus without complication, without long-term current use of insulin [facility])   [facility] at [place], Ranell Patrick, MD   1 year ago Dizziness   Primary Care at [place], [place] D, PA-C   1 year ago Type 2 diabetes mellitus without complication, without long-term current use of insulin [facility])   Primary Care at [place], [place] D, PA-C   1 year ago Allergic rhinitis, unspecified seasonality, unspecified trigger   Primary Care at [place], [place] D, PA-C              Passed - Cr in normal range and within 360 days    Creat  Date Value Ref Range Status  09/25/2015 0.85 0.50 - 1.05 mg/dL Final    Comment:      For patients > or = 61 years of age: The upper reference limit for Creatinine is approximately 13% higher for people identified as African-American.      Creatinine, Ser   Date Value Ref Range Status  05/11/2018 0.87 0.57 - 1.00 mg/dL Final          Passed - eGFR in normal range and within 360 days    GFR, Est African American  Date Value Ref Range Status  09/25/2015 88 >=60 mL/min Final   GFR calc Af Amer  Date Value Ref Range Status  05/11/2018 84 >59 mL/min/1.73 Final   GFR, Est Non African American  Date Value Ref Range Status  09/25/2015 76 >=60 mL/min Final   GFR calc non Af Amer  Date Value Ref Range Status  05/11/2018 73 >59 mL/min/1.73 Final

## 2019-03-14 NOTE — Telephone Encounter (Signed)
Please schedule patient an appt for refills 

## 2019-03-17 DIAGNOSIS — Z008 Encounter for other general examination: Secondary | ICD-10-CM | POA: Diagnosis not present

## 2019-03-19 NOTE — Telephone Encounter (Signed)
Called pt. With message to call back per [facility]

## 2019-05-04 ENCOUNTER — Ambulatory Visit: Payer: Federal, State, Local not specified - PPO | Attending: Internal Medicine

## 2019-05-04 DIAGNOSIS — Z23 Encounter for immunization: Secondary | ICD-10-CM

## 2019-05-04 NOTE — Progress Notes (Signed)
   Covid-19 Vaccination Clinic  Name:  Kelly Fitzpatrick    MRN: [id number] DOB: [birth year]  05/04/2019  [name] was observed post Covid-19 immunization for 15 minutes without incident. She was provided with Vaccine Information Sheet and instruction to access the V-Safe system.   [name] was instructed to call 911 with any severe reactions post vaccine: Marland Kitchen Difficulty breathing  . Swelling of face and throat  . A fast heartbeat  . A bad rash all over body  . Dizziness and weakness   Immunizations Administered    Name Date Dose VIS Date Route   Pfizer COVID-19 Vaccine 05/04/2019  9:18 AM 0.3 mL 02/01/2019 Intramuscular   Manufacturer: [employer], [employer]   Lot: [id number]   NDC: [address]-8

## 2019-05-14 ENCOUNTER — Other Ambulatory Visit: Payer: Self-pay

## 2019-05-14 ENCOUNTER — Telehealth: Payer: Self-pay | Admitting: Family Medicine

## 2019-05-14 NOTE — Telephone Encounter (Signed)
What is the name of the medication? metFORMIN (GLUCOPHAGE-XR) 500 MG 24 hr tablet    Have you contacted your pharmacy to request a refill? n  Which pharmacy would you like this sent to?  [id number] [place], [place] - [address]  [address] [place] [address]  Phone:  [phone] Fax:  [phone]     Patient notified that their request is being sent to the clinical staff for review and that they should receive a call once it is complete. If they do not receive a call within 72 hours they can check with their pharmacy or our office.   Pt has only one week  Left and has a physical scheduled for April  Pt is asking for refills

## 2019-05-16 DIAGNOSIS — H43812 Vitreous degeneration, left eye: Secondary | ICD-10-CM | POA: Diagnosis not present

## 2019-05-16 DIAGNOSIS — H0102A Squamous blepharitis right eye, upper and lower eyelids: Secondary | ICD-10-CM | POA: Diagnosis not present

## 2019-05-16 DIAGNOSIS — E119 Type 2 diabetes mellitus without complications: Secondary | ICD-10-CM | POA: Diagnosis not present

## 2019-05-16 DIAGNOSIS — H25813 Combined forms of age-related cataract, bilateral: Secondary | ICD-10-CM | POA: Diagnosis not present

## 2019-05-28 ENCOUNTER — Ambulatory Visit: Payer: Federal, State, Local not specified - PPO | Attending: Internal Medicine

## 2019-05-28 ENCOUNTER — Ambulatory Visit: Payer: Federal, State, Local not specified - PPO

## 2019-05-28 DIAGNOSIS — Z23 Encounter for immunization: Secondary | ICD-10-CM

## 2019-05-28 NOTE — Progress Notes (Signed)
   Covid-19 Vaccination Clinic  Name:  Kelly Fitzpatrick    MRN: [id number] DOB: 1960-11-08  05/28/2019  [name] was observed post Covid-19 immunization for 15 minutes without incident. She was provided with Vaccine Information Sheet and instruction to access the V-Safe system.   [name] was instructed to call 911 with any severe reactions post vaccine: Marland Kitchen Difficulty breathing  . Swelling of face and throat  . A fast heartbeat  . A bad rash all over body  . Dizziness and weakness   Immunizations Administered    Name Date Dose VIS Date Route   Pfizer COVID-19 Vaccine 05/28/2019 12:34 PM 0.3 mL 02/01/2019 Intramuscular   Manufacturer: [employer], [employer]   Lot: [id number]   NDC: [address]-3

## 2019-05-29 ENCOUNTER — Ambulatory Visit (INDEPENDENT_AMBULATORY_CARE_PROVIDER_SITE_OTHER): Payer: Federal, State, Local not specified - PPO

## 2019-05-29 ENCOUNTER — Ambulatory Visit (INDEPENDENT_AMBULATORY_CARE_PROVIDER_SITE_OTHER): Payer: Federal, State, Local not specified - PPO | Admitting: Adult Health Nurse Practitioner

## 2019-05-29 ENCOUNTER — Other Ambulatory Visit: Payer: Self-pay

## 2019-05-29 VITALS — BP 135/76 | HR 79 | Temp 97.6°F | Ht 62.0 in | Wt 135.8 lb

## 2019-05-29 DIAGNOSIS — Z1211 Encounter for screening for malignant neoplasm of colon: Secondary | ICD-10-CM

## 2019-05-29 DIAGNOSIS — R079 Chest pain, unspecified: Secondary | ICD-10-CM

## 2019-05-29 DIAGNOSIS — M4712 Other spondylosis with myelopathy, cervical region: Secondary | ICD-10-CM

## 2019-05-29 DIAGNOSIS — Z1231 Encounter for screening mammogram for malignant neoplasm of breast: Secondary | ICD-10-CM

## 2019-05-29 DIAGNOSIS — M4722 Other spondylosis with radiculopathy, cervical region: Secondary | ICD-10-CM

## 2019-05-29 DIAGNOSIS — E119 Type 2 diabetes mellitus without complications: Secondary | ICD-10-CM | POA: Diagnosis not present

## 2019-05-29 DIAGNOSIS — E785 Hyperlipidemia, unspecified: Secondary | ICD-10-CM | POA: Diagnosis not present

## 2019-05-29 DIAGNOSIS — Z0001 Encounter for general adult medical examination with abnormal findings: Secondary | ICD-10-CM | POA: Diagnosis not present

## 2019-05-29 DIAGNOSIS — Z0000 Encounter for general adult medical examination without abnormal findings: Secondary | ICD-10-CM

## 2019-05-29 DIAGNOSIS — R0981 Nasal congestion: Secondary | ICD-10-CM

## 2019-05-29 DIAGNOSIS — E1140 Type 2 diabetes mellitus with diabetic neuropathy, unspecified: Secondary | ICD-10-CM

## 2019-05-29 DIAGNOSIS — M542 Cervicalgia: Secondary | ICD-10-CM | POA: Diagnosis not present

## 2019-05-29 LAB — POCT URINALYSIS DIP (MANUAL ENTRY)
Bilirubin, UA: NEGATIVE
Blood, UA: NEGATIVE
Glucose, UA: NEGATIVE mg/dL
Leukocytes, UA: NEGATIVE
Nitrite, UA: NEGATIVE
Protein Ur, POC: NEGATIVE mg/dL
Spec Grav, UA: 1.025 (ref 1.010–1.025)
Urobilinogen, UA: 0.2 E.U./dL
pH, UA: 5 (ref 5.0–8.0)

## 2019-05-29 MED ORDER — GABAPENTIN 100 MG PO CAPS: 100.0000 mg | ORAL_CAPSULE | Freq: Every day | ORAL | 3 refills | Status: DC

## 2019-05-29 NOTE — Progress Notes (Signed)
amb  

## 2019-05-29 NOTE — Patient Instructions (Signed)
° ° ° °  If you have lab work done today you will be contacted with your lab results within the next 2 weeks.  If you have not heard from us then please contact us. The fastest way to get your results is to register for My Chart. ° ° °IF you received an x-ray today, you will receive an invoice from [facility]. Please contact [facility] at [phone] with questions or concerns regarding your invoice.  ° °IF you received labwork today, you will receive an invoice from [facility]. Please contact [facility] at [phone] with questions or concerns regarding your invoice.  ° °Our billing staff will not be able to assist you with questions regarding bills from these companies. ° °You will be contacted with the lab results as soon as they are available. The fastest way to get your results is to activate your My Chart account. Instructions are located on the last page of this paperwork. If you have not heard from us regarding the results in 2 weeks, please contact this office. °  ° ° ° °

## 2019-05-29 NOTE — Progress Notes (Signed)
Chief Complaint  Patient presents with  . Annual Exam    Pt stated that her Lt arm has been hurting for the past several  months.    HPI   Left deltoid, hurting since fall , muscles in left arm feel weaker.  Pain presenting in a C5 distribution.  Intermittent pain with activity.   In addition, she is having some chest pain, mostly central, mid-sternal without radiation to arm or jaw.  Reviewed referral to cardiology.  Patient is amenable to that.   Lastly, requests referral to ENT.    Problem List    Problem List: 2014-04: Hyperlipidemia 2014-04: Diabetes ([place])   Allergies   is allergic to cephalexin; amoxicillin; hyoscyamine sulfate; and sulfa antibiotics.  Medications    Current Outpatient Medications:  .  aspirin 81 MG chewable tablet, Chew 81 mg by mouth daily., Disp: , Rfl:  .  Blood Pressure Monitor DEVI, 1 Units by Does not apply route daily as needed., Disp: 1 Device, Rfl: 0 .  Calcium Carbonate (CALCIUM 600 PO), Take by mouth., Disp: , Rfl:  .  calcium gluconate 500 MG tablet, Take 500 mg by mouth daily. Reported on 06/29/2015, Disp: , Rfl:  .  cholecalciferol (VITAMIN D) 400 UNITS TABS, Take 400 Units by mouth daily., Disp: , Rfl:  .  Continuous Blood Gluc Receiver (FREESTYLE LIBRE 14 DAY READER) DEVI, 1 Units by Does not apply route daily as needed., Disp: 1 Device, Rfl: 0 .  Continuous Blood Gluc Sensor (FREESTYLE LIBRE 14 DAY SENSOR) MISC, APPLY AS DIRECTED EVERY 14 DAYS, Disp: 2 each, Rfl: 2 .  fluticasone (FLONASE) 50 MCG/ACT nasal spray, SPRAY 2 SPRAYS INTO EACH NOSTRIL EVERY DAY, Disp: 48 mL, Rfl: 2 .  Garlic 10 MG CAPS, Take 1 tablet by mouth daily., Disp: , Rfl:  .  glucose blood (FREESTYLE PRECISION NEO TEST) test strip, Use as instructed, Disp: 50 each, Rfl: 2 .  Lancets MISC, To check blood sugar daily dx code E11.65, Disp: 100 each, Rfl: 0 .  lisinopril (ZESTRIL) 5 MG tablet, TAKE 1 TABLET BY MOUTH EVERY DAY, Disp: 90 tablet, Rfl: 0 .   loratadine-pseudoephedrine (CLARITIN-D 12-HOUR) 5-120 MG per tablet, Take 1 tablet by mouth 2 (two) times daily as needed. Reported on 06/29/2015, Disp: , Rfl:  .  metFORMIN (GLUCOPHAGE-XR) 500 MG 24 hr tablet, TAKE 1 TABLET BY MOUTH EVERY DAY WITH BREAKFAST, Disp: 30 tablet, Rfl: 0 .  ONE TOUCH ULTRA TEST test strip, 1 EACH BY OTHER ROUTE DAILY. DX E11.65, Disp: 100 each, Rfl: 0 .  pravastatin (PRAVACHOL) 40 MG tablet, TAKE 1 TABLET BY MOUTH EVERY DAY, Disp: 90 tablet, Rfl: 3   Review of Systems    Constitutional: Negative for activity change, appetite change, chills and fever.  HENT: Negative for congestion, nosebleeds, trouble swallowing and voice change.   Respiratory: Negative for cough, shortness of breath and wheezing.   Cardiac:  Negative for chest pain, pressure, syncope  Gastrointestinal: Negative for diarrhea, nausea and vomiting.  Genitourinary: Negative for difficulty urinating, dysuria, flank pain and hematuria.  Musculoskeletal: Negative for back pain, joint swelling and neck pain.  Neurological: Negative for dizziness, speech difficulty, light-headedness and numbness.  See HPI. All other review of systems negative.     Physical Exam:    height is 5\' 2"  (1.575 m) and weight is 135 lb 12.8 oz (61.6 kg). Her temporal temperature is 97.6 F (36.4 C). Her blood pressure is 135/76 and her pulse is 79. Her oxygen saturation  is 99%.   Physical Examination: General appearance - alert, well appearing, and in no distress and oriented to person, place, and time Mental status - normal mood, behavior, speech, dress, motor activity, and thought processes Eyes - PERRL. Extraocular movements intact.  No nystagmus.  Neck - supple, no significant adenopathy, carotids upstroke normal bilaterally, no bruits, thyroid exam: thyroid is normal in size without nodules or tenderness Chest - clear to auscultation, no wheezes, rales or rhonchi, symmetric air entry  Heart - normal rate, regular rhythm,  normal S1, S2, no murmurs, rubs, clicks or gallops Extremities - dependent LE edema without clubbing or cyanosis Skin - normal coloration and turgor, no rashes, no suspicious skin lesions noted  No hyperpigmentation of skin.  No current hematomas noted   Lab /Imaging Review   Personally reviewed imaging of the cervical spine which shows: anteriolithesis at C4 on C5 with degenerative changes at C5-C7  Assessment & Plan:  Kelly Fitzpatrick is a 59 y.o. female    1. Encounter for screening mammogram for malignant neoplasm of breast   2. Controlled type 2 diabetes mellitus with neuropathy (HCC)   3. Type 2 diabetes mellitus without complication, without long-term current use of insulin (HCC)   4. Hyperlipidemia, unspecified hyperlipidemia type   5. Annual physical exam   6. Screening for colon cancer    Orders Placed This Encounter  Procedures  . Microalbumin, urine  . POCT urinalysis dipstick   No orders of the defined types were placed in this encounter.    Elyse Jarvis, NP

## 2019-05-30 LAB — HEMOGLOBIN A1C
Est. average glucose Bld gHb Est-mCnc: 143 mg/dL
Hgb A1c MFr Bld: 6.6 % — ABNORMAL HIGH (ref 4.8–5.6)

## 2019-05-30 LAB — LIPID PANEL
Chol/HDL Ratio: 2.3 ratio (ref 0.0–4.4)
Cholesterol, Total: 138 mg/dL (ref 100–199)
HDL: 59 mg/dL (ref >39)
LDL Chol Calc (NIH): 69 mg/dL (ref 0–99)
Triglycerides: 42 mg/dL (ref 0–149)
VLDL Cholesterol Cal: 10 mg/dL (ref 5–40)

## 2019-05-30 LAB — CMP14+EGFR
ALT: 11 IU/L (ref 0–32)
AST: 13 IU/L (ref 0–40)
Albumin/Globulin Ratio: 1.8 (ref 1.2–2.2)
Albumin: 4.4 g/dL (ref 3.8–4.8)
Alkaline Phosphatase: 81 IU/L (ref 39–117)
BUN/Creatinine Ratio: 18 (ref 12–28)
BUN: 17 mg/dL (ref 8–27)
Bilirubin Total: 0.5 mg/dL (ref 0.0–1.2)
CO2: 22 mmol/L (ref 20–29)
Calcium: 9.8 mg/dL (ref 8.7–10.3)
Chloride: 107 mmol/L — ABNORMAL HIGH (ref 96–106)
Creatinine, Ser: 0.93 mg/dL (ref 0.57–1.00)
GFR calc Af Amer: 77 mL/min/{1.73_m2} (ref >59)
GFR calc non Af Amer: 67 mL/min/{1.73_m2} (ref >59)
Globulin, Total: 2.5 g/dL (ref 1.5–4.5)
Glucose: 123 mg/dL — ABNORMAL HIGH (ref 65–99)
Potassium: 4.8 mmol/L (ref 3.5–5.2)
Sodium: 143 mmol/L (ref 134–144)
Total Protein: 6.9 g/dL (ref 6.0–8.5)

## 2019-05-30 LAB — SEDIMENTATION RATE: Sed Rate: 8 mm/hr (ref 0–40)

## 2019-05-30 LAB — HEPATITIS C ANTIBODY: Hep C Virus Ab: <0.1 {s_co_ratio} (ref 0.0–0.9)

## 2019-05-30 LAB — TSH: TSH: 1.32 u[IU]/mL (ref 0.450–4.500)

## 2019-05-30 LAB — MICROALBUMIN, URINE: Microalbumin, Urine: 5.7 ug/mL

## 2019-06-11 DIAGNOSIS — Z1211 Encounter for screening for malignant neoplasm of colon: Secondary | ICD-10-CM | POA: Insufficient documentation

## 2019-06-11 DIAGNOSIS — E1140 Type 2 diabetes mellitus with diabetic neuropathy, unspecified: Secondary | ICD-10-CM | POA: Insufficient documentation

## 2019-06-11 DIAGNOSIS — R0981 Nasal congestion: Secondary | ICD-10-CM | POA: Insufficient documentation

## 2019-06-11 DIAGNOSIS — R079 Chest pain, unspecified: Secondary | ICD-10-CM | POA: Insufficient documentation

## 2019-06-11 DIAGNOSIS — M4712 Other spondylosis with myelopathy, cervical region: Secondary | ICD-10-CM | POA: Insufficient documentation

## 2019-06-11 DIAGNOSIS — Z1231 Encounter for screening mammogram for malignant neoplasm of breast: Secondary | ICD-10-CM | POA: Insufficient documentation

## 2019-06-11 DIAGNOSIS — Z0000 Encounter for general adult medical examination without abnormal findings: Secondary | ICD-10-CM | POA: Insufficient documentation

## 2019-06-18 ENCOUNTER — Encounter: Payer: Self-pay | Admitting: Cardiology

## 2019-06-18 ENCOUNTER — Ambulatory Visit: Payer: Federal, State, Local not specified - PPO | Admitting: Cardiology

## 2019-06-18 ENCOUNTER — Other Ambulatory Visit: Payer: Self-pay

## 2019-06-18 VITALS — BP 128/70 | HR 69 | Ht 62.0 in | Wt 141.8 lb

## 2019-06-18 DIAGNOSIS — E7800 Pure hypercholesterolemia, unspecified: Secondary | ICD-10-CM | POA: Diagnosis not present

## 2019-06-18 DIAGNOSIS — R079 Chest pain, unspecified: Secondary | ICD-10-CM | POA: Diagnosis not present

## 2019-06-18 DIAGNOSIS — E119 Type 2 diabetes mellitus without complications: Secondary | ICD-10-CM | POA: Diagnosis not present

## 2019-06-18 DIAGNOSIS — R072 Precordial pain: Secondary | ICD-10-CM | POA: Diagnosis not present

## 2019-06-18 MED ORDER — METOPROLOL TARTRATE 100 MG PO TABS
100.0000 mg | ORAL_TABLET | Freq: Once | ORAL | 0 refills | Status: DC
Start: 2019-06-18 — End: 2020-04-01

## 2019-06-18 NOTE — Progress Notes (Signed)
Cardiology Consult  Note    Date:  06/18/2019   ID:  Kelly Fitzpatrick, DOB 23-Jun-1960, MRN [id number]  PCP:  Forrest Moron, MD  Cardiologist:  Fransico Him, MD   Chief Complaint  Patient presents with  . New Patient (Initial Visit)    Chest pain    History of Present Illness:  Kelly Fitzpatrick is a 59 y.o. female who is being seen today for the evaluation of chest pain  at the request of Wendall Mola, NP.  This is a 59yo female with a hx of DM type 2, HLD and endometriosis.  She was recently seen for routine PE and mentioned that she had been having episodes of CP that were central in location but not severe and like a pin prick without any radiation and no associated SOB, diaphoresis or nausea.  She also had been having some problems with left arm pain that she describes as an achiness in her left elbow that sometimes would throb and then sometimes would hurt in bed at night certain ways she would move. The left arm pain and chest pain do not occur together and her PCP felt that it was likely a C5 radiculopathy.  She is now here for further evaluation. She has never smoked.    Past Medical History:  Diagnosis Date  . Allergy   . Anemia   . Diabetes mellitus   . Endometriosis   . Hypercholesteremia   . Sickle cell trait [facility])     Past Surgical History:  Procedure Laterality Date  . ABDOMINAL HYSTERECTOMY    . COLON SURGERY    . COLOSTOMY    . COLOSTOMY CLOSURE      Current Medications: Current Meds  Medication Sig  . aspirin 81 MG chewable tablet Chew 81 mg by mouth daily.  . Blood Pressure Monitor DEVI 1 Units by Does not apply route daily as needed.  . Calcium Carbonate (CALCIUM 600 PO) Take by mouth.  . calcium gluconate 500 MG tablet Take 500 mg by mouth daily. Reported on 06/29/2015  . cholecalciferol (VITAMIN D) 400 UNITS TABS Take 400 Units by mouth daily.  . Continuous Blood Gluc Receiver (FREESTYLE LIBRE 14 DAY READER) DEVI 1 Units by Does not apply  route daily as needed.  . Continuous Blood Gluc Sensor (FREESTYLE LIBRE 14 DAY SENSOR) MISC APPLY AS DIRECTED EVERY 14 DAYS  . fluticasone (FLONASE) 50 MCG/ACT nasal spray SPRAY 2 SPRAYS INTO EACH NOSTRIL EVERY DAY  . gabapentin (NEURONTIN) 100 MG capsule Take 1 capsule (100 mg total) by mouth at bedtime.  . Garlic 4696 MG TBEC Take 1 tablet by mouth daily.   Marland Kitchen glucose blood (FREESTYLE PRECISION NEO TEST) test strip Use as instructed  . Lancets MISC To check blood sugar daily dx code E11.65  . lisinopril (ZESTRIL) 5 MG tablet TAKE 1 TABLET BY MOUTH EVERY DAY  . loratadine-pseudoephedrine (CLARITIN-D 24-HOUR) 10-240 MG 24 hr tablet Take 1 tablet by mouth 2 (two) times daily as needed. Reported on 06/29/2015  . metFORMIN (GLUCOPHAGE-XR) 500 MG 24 hr tablet TAKE 1 TABLET BY MOUTH EVERY DAY WITH BREAKFAST  . ONE TOUCH ULTRA TEST test strip 1 EACH BY OTHER ROUTE DAILY. DX E11.65  . pravastatin (PRAVACHOL) 40 MG tablet TAKE 1 TABLET BY MOUTH EVERY DAY    Allergies:   Cephalexin, Amoxicillin, Hyoscyamine sulfate, and Sulfa antibiotics   Social History   Socioeconomic History  . Marital status: Single    Spouse name: Not  on file  . Number of children: 0  . Years of education: Not on file  . Highest education level: Not on file  Occupational History  . Not on file  Tobacco Use  . Smoking status: Never Smoker  . Smokeless tobacco: Never Used  Substance and Sexual Activity  . Alcohol use: No  . Drug use: No  . Sexual activity: Not Currently    Birth control/protection: Abstinence  Other Topics Concern  . Not on file  Social History Narrative   She is from [place], [place]. Lived in [place] for 29 years. 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 Single, no children. Not currently sexually active.    Social Determinants of Health   Financial Resource Strain:   . Difficulty of Paying Living Expenses:   Food Insecurity:   . Worried About Programme researcher, broadcasting/film/video in the Last Year:   . Occupational psychologist in the Last Year:   Transportation Needs:   . Freight forwarder (Medical):   Marland Kitchen Lack of Transportation (Non-Medical):   Physical Activity:   . Days of Exercise per Week:   . Minutes of Exercise per Session:   Stress:   . Feeling of Stress :   Social Connections:   . Frequency of Communication with Friends and Family:   . Frequency of Social Gatherings with Friends and Family:   . Attends Religious Services:   . Active Member of Clubs or Organizations:   . Attends Banker Meetings:   Marland Kitchen Marital Status:      Family History:  The patient's family history includes Breast cancer in her maternal aunt; Breast cancer (age of onset: 28) in her mother; Cancer in her father and mother; Heart disease in her brother; Hypertension in her mother; Multiple sclerosis in her brother.   ROS:   Please see the history of present illness.    ROS All other systems reviewed and are negative.  No flowsheet data found.     PHYSICAL EXAM:   VS:  BP 128/70   Pulse 69   Ht 5\' 2"  (1.575 m)   Wt 141 lb 12.8 oz (64.3 kg)   SpO2 98%   BMI 25.94 kg/m    GEN: Well nourished, well developed, in no acute distress  HEENT: normal  Neck: no JVD, carotid bruits, or masses Cardiac: RRR; no murmurs, rubs, or gallops,no edema.  Intact distal pulses bilaterally.  Respiratory:  clear to auscultation bilaterally, normal work of breathing GI: soft, nontender, nondistended, + BS MS: no deformity or atrophy  Skin: warm and dry, no rash Neuro:  Alert and Oriented x 3, Strength and sensation are intact Psych: euthymic mood, full affect  Wt Readings from Last 3 Encounters:  06/18/19 141 lb 12.8 oz (64.3 kg)  05/29/19 135 lb 12.8 oz (61.6 kg)  05/11/18 137 lb (62.1 kg)      Studies/Labs Reviewed:   EKG:  EKG is ordered today.  The ekg ordered today demonstrates NSR with no ST chnages  Recent Labs: 05/29/2019: ALT 11; BUN 17; Creatinine, Ser 0.93; Potassium 4.8; Sodium 143; TSH 1.320     Lipid Panel    Component Value Date/Time   CHOL 138 05/29/2019 1151   TRIG 42 05/29/2019 1151   HDL 59 05/29/2019 1151   CHOLHDL 2.3 05/29/2019 1151   CHOLHDL 3.8 09/25/2015 1044   VLDL 14 09/25/2015 1044   LDLCALC 69 05/29/2019 1151    Additional studies/ records that were reviewed today include:  Office notes from PCP and EKG    ASSESSMENT:    1. Chest pain of uncertain etiology   2. Pure hypercholesterolemia   3. DM type 2, goal HbA1c < 7% (HCC)      PLAN:  In order of problems listed above:  1. Chest pain -this is somewhat atypical in that there are no associated sx and no radiation -her CRF include DM, HLD and fm hx of CAD -I will get a coronary CTA to assess calcium score and coronary plaque  2.  HLD -followed by PCP -continue Lisinopril 5mg  daily  3.  Type 2 DM -followed by PCP -HbA1 C was 6.6% this month -continue Lisinopril 2.5mg  daily for renal protection -continue Metformin  Medication Adjustments/Labs and Tests Ordered: Current medicines are reviewed at length with the patient today.  Concerns regarding medicines are outlined above.  Medication changes, Labs and Tests ordered today are listed in the Patient Instructions below.  Patient Instructions  Medication Instructions:  Your physician recommends that you continue on your current medications as directed. Please refer to the Current Medication list given to you today.  *If you need a refill on your cardiac medications before your next appointment, please call your pharmacy*   Follow-Up: At [facility], you and your health needs are our priority.  As part of our continuing mission to provide you with exceptional heart care, we have created designated Provider Care Teams.  These Care Teams include your primary Cardiologist (physician) and Advanced Practice Providers (APPs -  Physician Assistants and Nurse Practitioners) who all work together to provide you with the care you need, when you need  it.  We recommend signing up for the patient portal called "MyChart".  Sign up information is provided on this After Visit Summary.  MyChart is used to connect with patients for Virtual Visits (Telemedicine).  Patients are able to view lab/test results, encounter notes, upcoming appointments, etc.  Non-urgent messages can be sent to your provider as well.   To learn more about what you can do with MyChart, go to [facility].    Follow up with [email or web address] as needed based on results from testing.    Other Instructions Your cardiac CT will be scheduled at:   [facility] [address] [place], [place] [phone]   Please arrive at the [facility] main entrance of [facility] 30 minutes prior to test start time. Proceed to the [facility] Radiology Department (first floor) to check-in and test prep.  Please follow these instructions carefully (unless otherwise directed):   On the Night Before the Test: . Be sure to Drink plenty of water. . Do not consume any caffeinated/decaffeinated beverages or chocolate 12 hours prior to your test. . Do not take any antihistamines 12 hours prior to your test. . If you take Metformin do not take 24 hours prior to test.  On the Day of the Test: . Drink plenty of water. Do not drink any water within one hour of the test. . Do not eat any food 4 hours prior to the test. . You may take your regular medications prior to the test.  . Take metoprolol (Lopressor) two hours prior to test. . FEMALES- please wear underwire-free bra if available    After the Test: . Drink plenty of water. . After receiving IV contrast, you may experience a mild flushed feeling. This is normal. . On occasion, you may experience a mild rash up to 24 hours  after the test. This is not dangerous. If this occurs, you can take Benadryl 25 mg and increase your fluid intake. . If you experience trouble breathing, this can be serious. If it  is severe call 911 IMMEDIATELY. If it is mild, please call our office. . If you take any of these medications: Glipizide/Metformin, Avandament, Glucavance, please do not take 48 hours after completing test unless otherwise instructed.   Once we have confirmed authorization from your insurance company, we will call you to set up a date and time for your test.   For non-scheduling related questions, please contact the cardiac imaging nurse navigator should you have any questions/concerns: Rockwell Alexandria, RN Navigator Cardiac Imaging [facility] [phone] office  For scheduling needs, including cancellations and rescheduling, please call [phone].        Signed, Armanda Magic, MD  06/18/2019 9:31 PM    [facility] [address] [place], [place], [place]  [address] Phone: [phone]; Fax: [phone]

## 2019-06-18 NOTE — Patient Instructions (Addendum)
Medication Instructions:  Your physician recommends that you continue on your current medications as directed. Please refer to the Current Medication list given to you today.  *If you need a refill on your cardiac medications before your next appointment, please call your pharmacy*   Follow-Up: At [facility], you and your health needs are our priority.  As part of our continuing mission to provide you with exceptional heart care, we have created designated Provider Care Teams.  These Care Teams include your primary Cardiologist (physician) and Advanced Practice Providers (APPs -  Physician Assistants and Nurse Practitioners) who all work together to provide you with the care you need, when you need it.  We recommend signing up for the patient portal called "MyChart".  Sign up information is provided on this After Visit Summary.  MyChart is used to connect with patients for Virtual Visits (Telemedicine).  Patients are able to view lab/test results, encounter notes, upcoming appointments, etc.  Non-urgent messages can be sent to your provider as well.   To learn more about what you can do with MyChart, go to [email or web address].    Follow up with Dr. Mayford Knife as needed based on results from testing.    Other Instructions Your cardiac CT will be scheduled at:   [facility] [address] [place], [place] [address] [phone]   Please arrive at the [facility] main entrance of [facility] 30 minutes prior to test start time. Proceed to the [facility] Radiology Department (first floor) to check-in and test prep.  Please follow these instructions carefully (unless otherwise directed):   On the Night Before the Test: . Be sure to Drink plenty of water. . Do not consume any caffeinated/decaffeinated beverages or chocolate 12 hours prior to your test. . Do not take any antihistamines 12 hours prior to your test. . If you take Metformin do not take 24 hours prior  to test.  On the Day of the Test: . Drink plenty of water. Do not drink any water within one hour of the test. . Do not eat any food 4 hours prior to the test. . You may take your regular medications prior to the test.  . Take metoprolol (Lopressor) two hours prior to test. . FEMALES- please wear underwire-free bra if available    After the Test: . Drink plenty of water. . After receiving IV contrast, you may experience a mild flushed feeling. This is normal. . On occasion, you may experience a mild rash up to 24 hours after the test. This is not dangerous. If this occurs, you can take Benadryl 25 mg and increase your fluid intake. . If you experience trouble breathing, this can be serious. If it is severe call 911 IMMEDIATELY. If it is mild, please call our office. . If you take any of these medications: Glipizide/Metformin, Avandament, Glucavance, please do not take 48 hours after completing test unless otherwise instructed.   Once we have confirmed authorization from your insurance company, we will call you to set up a date and time for your test.   For non-scheduling related questions, please contact the cardiac imaging nurse navigator should you have any questions/concerns: Rockwell Alexandria, RN Navigator Cardiac Imaging [facility] [phone] office  For scheduling needs, including cancellations and rescheduling, please call [phone].

## 2019-06-19 DIAGNOSIS — H43812 Vitreous degeneration, left eye: Secondary | ICD-10-CM | POA: Diagnosis not present

## 2019-06-19 DIAGNOSIS — H25813 Combined forms of age-related cataract, bilateral: Secondary | ICD-10-CM | POA: Diagnosis not present

## 2019-06-19 DIAGNOSIS — H0102A Squamous blepharitis right eye, upper and lower eyelids: Secondary | ICD-10-CM | POA: Diagnosis not present

## 2019-06-19 DIAGNOSIS — E119 Type 2 diabetes mellitus without complications: Secondary | ICD-10-CM | POA: Diagnosis not present

## 2019-06-19 LAB — HM DIABETES EYE EXAM

## 2019-06-25 ENCOUNTER — Other Ambulatory Visit: Payer: Self-pay

## 2019-06-25 ENCOUNTER — Telehealth: Payer: Self-pay | Admitting: Family Medicine

## 2019-06-25 DIAGNOSIS — E119 Type 2 diabetes mellitus without complications: Secondary | ICD-10-CM

## 2019-06-25 MED ORDER — METFORMIN HCL ER 500 MG PO TB24: 500.0000 mg | ORAL_TABLET | Freq: Every day | ORAL | 1 refills | Status: DC

## 2019-06-25 NOTE — Telephone Encounter (Signed)
Pt called stating she needs the   metFORMIN (GLUCOPHAGE-XR) 500 MG 24 hr tablet [[id number]]   Medication refilled. Pt states she had her physcial appt on 05/29/19 with NP Byrd. Pt would like a call when this is sent into the pharmacy.  [id number] - [place], [place] - [address]

## 2019-07-03 ENCOUNTER — Other Ambulatory Visit: Payer: Self-pay | Admitting: Physician Assistant

## 2019-07-03 DIAGNOSIS — Z1231 Encounter for screening mammogram for malignant neoplasm of breast: Secondary | ICD-10-CM

## 2019-07-05 ENCOUNTER — Other Ambulatory Visit: Payer: Self-pay

## 2019-07-05 ENCOUNTER — Other Ambulatory Visit: Payer: Federal, State, Local not specified - PPO | Admitting: *Deleted

## 2019-07-05 DIAGNOSIS — R079 Chest pain, unspecified: Secondary | ICD-10-CM

## 2019-07-06 LAB — BASIC METABOLIC PANEL
BUN/Creatinine Ratio: 18 (ref 12–28)
BUN: 16 mg/dL (ref 8–27)
CO2: 22 mmol/L (ref 20–29)
Calcium: 9.5 mg/dL (ref 8.7–10.3)
Chloride: 105 mmol/L (ref 96–106)
Creatinine, Ser: 0.91 mg/dL (ref 0.57–1.00)
GFR calc Af Amer: 79 mL/min/{1.73_m2} (ref >59)
GFR calc non Af Amer: 68 mL/min/{1.73_m2} (ref >59)
Glucose: 89 mg/dL (ref 65–99)
Potassium: 4.2 mmol/L (ref 3.5–5.2)
Sodium: 141 mmol/L (ref 134–144)

## 2019-07-11 ENCOUNTER — Telehealth (HOSPITAL_COMMUNITY): Payer: Self-pay | Admitting: *Deleted

## 2019-07-11 NOTE — Telephone Encounter (Signed)
Attempted to call patient regarding upcoming cardiac CT appointment. Left message on voicemail with name and callback number  Anaston Koehn Tai RN Navigator Cardiac Imaging [facility] [phone] Office [phone] Cell  

## 2019-07-12 ENCOUNTER — Ambulatory Visit (HOSPITAL_COMMUNITY)
Admission: RE | Admit: 2019-07-12 | Discharge: 2019-07-12 | Disposition: A | Discharge status: Home / Self Care | Payer: Federal, State, Local not specified - PPO | Source: Ambulatory Visit | Attending: Cardiology | Admitting: Cardiology

## 2019-07-12 ENCOUNTER — Other Ambulatory Visit: Payer: Self-pay | Admitting: Physician Assistant

## 2019-07-12 ENCOUNTER — Telehealth: Payer: Self-pay | Admitting: Cardiology

## 2019-07-12 ENCOUNTER — Other Ambulatory Visit: Payer: Self-pay

## 2019-07-12 DIAGNOSIS — R072 Precordial pain: Secondary | ICD-10-CM | POA: Diagnosis not present

## 2019-07-12 DIAGNOSIS — Z1231 Encounter for screening mammogram for malignant neoplasm of breast: Secondary | ICD-10-CM

## 2019-07-12 MED ORDER — IOHEXOL 350 MG/ML SOLN
80.0000 mL | Freq: Once | INTRAVENOUS | Status: AC | PRN
  Administered 2019-07-12: 80 mL via INTRAVENOUS

## 2019-07-12 MED ORDER — NITROGLYCERIN 0.4 MG SL SUBL
0.8000 mg | SUBLINGUAL_TABLET | Freq: Once | SUBLINGUAL | Status: AC
  Administered 2019-07-12: 0.8 mg via SUBLINGUAL

## 2019-07-12 MED ORDER — NITROGLYCERIN 0.4 MG SL SUBL
SUBLINGUAL_TABLET | SUBLINGUAL | Status: AC
  Filled 2019-07-12: qty 2

## 2019-07-12 NOTE — Telephone Encounter (Signed)
[facility] called to give report to Dr. Mayford Knife.  Overread for CT heart score. Mild thoracic adenopathy incompletely imaged consider dedicated diagnostic chest ct to serve as a baseline for follow up.

## 2019-07-12 NOTE — Telephone Encounter (Signed)
New message   [facility] is calling with a CT Heart Score exam result.

## 2019-07-13 NOTE — Telephone Encounter (Signed)
Not sure what CT heart score is - I ordered a coronary CTA

## 2019-08-07 ENCOUNTER — Other Ambulatory Visit: Payer: Self-pay | Admitting: Family Medicine

## 2019-08-07 DIAGNOSIS — E119 Type 2 diabetes mellitus without complications: Secondary | ICD-10-CM

## 2019-08-07 NOTE — Telephone Encounter (Signed)
Requested Prescriptions  Pending Prescriptions Disp Refills  . lisinopril (ZESTRIL) 5 MG tablet [Pharmacy Med Name: LISINOPRIL 5 MG TABLET] 90 tablet 0    Sig: TAKE 1 TABLET BY MOUTH EVERY DAY     Cardiovascular:  ACE Inhibitors Passed - 08/07/2019  2:56 PM      Passed - Cr in normal range and within 180 days    Creat  Date Value Ref Range Status  09/25/2015 0.85 0.50 - 1.05 mg/dL Final    Comment:      For patients > or = 61 years of age: The upper reference limit for Creatinine is approximately 13% higher for people identified as African-American.      Creatinine, Ser  Date Value Ref Range Status  07/05/2019 0.91 0.57 - 1.00 mg/dL Final         Passed - K in normal range and within 180 days    Potassium  Date Value Ref Range Status  07/05/2019 4.2 3.5 - 5.2 mmol/L Final         Passed - Patient is not pregnant      Passed - Last BP in normal range    BP Readings from Last 1 Encounters:  07/12/19 110/69         Passed - Valid encounter within last 6 months    Recent Outpatient Visits          2 months ago Encounter for screening mammogram for malignant neoplasm of breast   Primary Care at [facility], Lonna Cobb, NP   1 year ago Controlled type 2 diabetes mellitus with neuropathy [facility])   Primary Care at [facility], Zoe A, MD   1 year ago Type 2 diabetes mellitus without complication, without long-term current use of insulin [facility])   Primary Care at [facility], Asencion Partridge, MD   1 year ago Dizziness   Primary Care at [place], [place] D, PA-C   1 year ago Type 2 diabetes mellitus without complication, without long-term current use of insulin [facility])   Primary Care at [facility], [place] D, PA-C

## 2019-08-10 ENCOUNTER — Other Ambulatory Visit: Payer: Self-pay | Admitting: Family Medicine

## 2019-08-10 DIAGNOSIS — E785 Hyperlipidemia, unspecified: Secondary | ICD-10-CM

## 2019-08-10 NOTE — Telephone Encounter (Signed)
Requested Prescriptions  Pending Prescriptions Disp Refills  . pravastatin (PRAVACHOL) 40 MG tablet [Pharmacy Med Name: PRAVASTATIN SODIUM 40 MG TAB] 90 tablet 2    Sig: TAKE 1 TABLET BY MOUTH EVERY DAY     Cardiovascular:  Antilipid - Statins Failed - 08/10/2019  9:49 AM      Failed - LDL in normal range and within 360 days    LDL Chol Calc (NIH)  Date Value Ref Range Status  05/29/2019 69 0 - 99 mg/dL Final         Passed - Total Cholesterol in normal range and within 360 days    Cholesterol, Total  Date Value Ref Range Status  05/29/2019 138 100 - 199 mg/dL Final         Passed - HDL in normal range and within 360 days    HDL  Date Value Ref Range Status  05/29/2019 59 >39 mg/dL Final         Passed - Triglycerides in normal range and within 360 days    Triglycerides  Date Value Ref Range Status  05/29/2019 42 0 - 149 mg/dL Final         Passed - Patient is not pregnant      Passed - Valid encounter within last 12 months    Recent Outpatient Visits          2 months ago Encounter for screening mammogram for malignant neoplasm of breast   Primary Care at [facility], Lonna Cobb, NP   1 year ago Controlled type 2 diabetes mellitus with neuropathy [facility])   Primary Care at [facility], Zoe A, MD   1 year ago Type 2 diabetes mellitus without complication, without long-term current use of insulin [facility])   Primary Care at [facility], Asencion Partridge, MD   1 year ago Dizziness   Primary Care at [place], [place] D, PA-C   1 year ago Type 2 diabetes mellitus without complication, without long-term current use of insulin [facility])   Primary Care at [facility], [place] D, PA-C

## 2019-08-20 DIAGNOSIS — H43812 Vitreous degeneration, left eye: Secondary | ICD-10-CM | POA: Diagnosis not present

## 2019-08-20 DIAGNOSIS — H0102B Squamous blepharitis left eye, upper and lower eyelids: Secondary | ICD-10-CM | POA: Diagnosis not present

## 2019-08-20 DIAGNOSIS — H0102A Squamous blepharitis right eye, upper and lower eyelids: Secondary | ICD-10-CM | POA: Diagnosis not present

## 2019-08-20 DIAGNOSIS — H25813 Combined forms of age-related cataract, bilateral: Secondary | ICD-10-CM | POA: Diagnosis not present

## 2019-09-04 ENCOUNTER — Other Ambulatory Visit: Payer: Self-pay | Admitting: Family Medicine

## 2019-09-04 ENCOUNTER — Other Ambulatory Visit: Payer: Self-pay

## 2019-09-04 ENCOUNTER — Telehealth: Payer: Self-pay | Admitting: Family Medicine

## 2019-09-04 ENCOUNTER — Ambulatory Visit
Admission: RE | Admit: 2019-09-04 | Discharge: 2019-09-04 | Disposition: A | Discharge status: Home / Self Care | Payer: Federal, State, Local not specified - PPO | Source: Ambulatory Visit | Attending: Family Medicine | Admitting: Family Medicine

## 2019-09-04 DIAGNOSIS — Z1231 Encounter for screening mammogram for malignant neoplasm of breast: Secondary | ICD-10-CM

## 2019-09-04 NOTE — Telephone Encounter (Signed)
Spoke to [employer]. Pt will need OV to discuss visit and talk to new provider. It looks like her Mammogram has as already been ordered.  \ Thanks

## 2019-09-04 NOTE — Telephone Encounter (Signed)
Pt came in and would like a nurse to give her a call regarding a recent CAT Scan she received about enlarged lymph nodes. Pt also wants to know what to do about her mammogram. Pt would like a call regarding this matter. Please advise.

## 2019-09-04 NOTE — Telephone Encounter (Signed)
Tried to contact pt but seem like vm is not set up. Pt is needing an appt for the concerns she is having. Pt has not been here since 06/12/19. Please advise.

## 2019-09-16 ENCOUNTER — Telehealth: Payer: Self-pay | Admitting: Cardiology

## 2019-09-16 ENCOUNTER — Telehealth: Payer: Self-pay | Admitting: Family Medicine

## 2019-09-16 NOTE — Telephone Encounter (Signed)
Attempted to call patient. Unable to leave voicemail.  

## 2019-09-16 NOTE — Telephone Encounter (Signed)
Dr. Mayford Knife order a CT that I had done in May that showed I had swollen lymph nodes.  Just had a mammogram done that showed heterogeneously denise.  Unable to see my PC MD note til the end of Sept.  Two years ago the scan showed I had scattered  Fibroglandular (no density).   Very concern.   Want to know if you can assist me with getting an appointment early with PC.

## 2019-09-18 DIAGNOSIS — E119 Type 2 diabetes mellitus without complications: Secondary | ICD-10-CM | POA: Diagnosis not present

## 2019-09-18 DIAGNOSIS — R590 Localized enlarged lymph nodes: Secondary | ICD-10-CM | POA: Diagnosis not present

## 2019-09-18 DIAGNOSIS — Z049 Encounter for examination and observation for unspecified reason: Secondary | ICD-10-CM | POA: Diagnosis not present

## 2019-09-18 DIAGNOSIS — J302 Other seasonal allergic rhinitis: Secondary | ICD-10-CM | POA: Diagnosis not present

## 2019-11-20 ENCOUNTER — Encounter: Payer: Federal, State, Local not specified - PPO | Admitting: Family Medicine

## 2019-12-19 DIAGNOSIS — I10 Essential (primary) hypertension: Secondary | ICD-10-CM | POA: Diagnosis not present

## 2019-12-19 DIAGNOSIS — R590 Localized enlarged lymph nodes: Secondary | ICD-10-CM | POA: Diagnosis not present

## 2019-12-19 DIAGNOSIS — E119 Type 2 diabetes mellitus without complications: Secondary | ICD-10-CM | POA: Diagnosis not present

## 2019-12-19 DIAGNOSIS — E785 Hyperlipidemia, unspecified: Secondary | ICD-10-CM | POA: Diagnosis not present

## 2020-01-10 ENCOUNTER — Telehealth: Payer: Self-pay | Admitting: Cardiology

## 2020-01-10 NOTE — Telephone Encounter (Signed)
Will forward to dr turner, dr Jens Som has never seen this patient.

## 2020-01-10 NOTE — Telephone Encounter (Signed)
Spoke with the patient who is wanting to have a follow up CT scan that was recommended based on finding of thoracic adenopathy from coronary CTA scan. I advised the patient that this is something that her PCP should order and follow for her. Patient verbalized understanding.

## 2020-01-10 NOTE — Telephone Encounter (Signed)
New Message:      Pt says she would like to have Dr Jens Som to order another CT for her please.

## 2020-03-21 ENCOUNTER — Emergency Department (HOSPITAL_COMMUNITY)
Admission: EM | Admit: 2020-03-21 | Discharge: 2020-03-21 | Disposition: A | Discharge status: Home / Self Care | Payer: Federal, State, Local not specified - PPO | Attending: Emergency Medicine | Admitting: Emergency Medicine

## 2020-03-21 ENCOUNTER — Emergency Department (HOSPITAL_COMMUNITY): Payer: Federal, State, Local not specified - PPO

## 2020-03-21 ENCOUNTER — Other Ambulatory Visit: Payer: Self-pay

## 2020-03-21 ENCOUNTER — Encounter (HOSPITAL_COMMUNITY): Payer: Self-pay | Admitting: Emergency Medicine

## 2020-03-21 DIAGNOSIS — Z7982 Long term (current) use of aspirin: Secondary | ICD-10-CM | POA: Diagnosis not present

## 2020-03-21 DIAGNOSIS — M25511 Pain in right shoulder: Secondary | ICD-10-CM | POA: Insufficient documentation

## 2020-03-21 DIAGNOSIS — E1140 Type 2 diabetes mellitus with diabetic neuropathy, unspecified: Secondary | ICD-10-CM | POA: Diagnosis not present

## 2020-03-21 DIAGNOSIS — Z79899 Other long term (current) drug therapy: Secondary | ICD-10-CM | POA: Diagnosis not present

## 2020-03-21 DIAGNOSIS — R0789 Other chest pain: Secondary | ICD-10-CM | POA: Insufficient documentation

## 2020-03-21 LAB — TROPONIN I (HIGH SENSITIVITY)
Troponin I (High Sensitivity): <2 ng/L (ref <18)
Troponin I (High Sensitivity): <2 ng/L (ref <18)

## 2020-03-21 LAB — CBC
HCT: 39.9 % (ref 36.0–46.0)
Hemoglobin: 13.2 g/dL (ref 12.0–15.0)
MCH: 26.9 pg (ref 26.0–34.0)
MCHC: 33.1 g/dL (ref 30.0–36.0)
MCV: 81.4 fL (ref 80.0–100.0)
Platelets: 237 10*3/uL (ref 150–400)
RBC: 4.9 MIL/uL (ref 3.87–5.11)
RDW: 14.5 % (ref 11.5–15.5)
WBC: 6.5 10*3/uL (ref 4.0–10.5)
nRBC: 0 % (ref 0.0–0.2)

## 2020-03-21 LAB — BASIC METABOLIC PANEL
Anion gap: 10 (ref 5–15)
BUN: 16 mg/dL (ref 8–23)
CO2: 23 mmol/L (ref 22–32)
Calcium: 9.9 mg/dL (ref 8.9–10.3)
Chloride: 106 mmol/L (ref 98–111)
Creatinine, Ser: 0.89 mg/dL (ref 0.44–1.00)
GFR, Estimated: >60 mL/min (ref >60)
Glucose, Bld: 162 mg/dL — ABNORMAL HIGH (ref 70–99)
Potassium: 3.6 mmol/L (ref 3.5–5.1)
Sodium: 139 mmol/L (ref 135–145)

## 2020-03-21 NOTE — ED Triage Notes (Signed)
Pt reports waking up with crick in neck Friday morning.  Reports R shoulder and shoulder blade pain throughout the day yesterday.  Today pain is radiating into R chest.  Pain worse with movement.

## 2020-03-21 NOTE — ED Provider Notes (Signed)
[facility] EMERGENCY DEPARTMENT Provider Note   CSN: [id number] Arrival date & time: 03/21/20  1524     History Chief Complaint  Patient presents with  . Shoulder Pain    Kelly Fitzpatrick is a 60 y.o. female.  HPI With history of cervical radiculopathy presents with a new right shoulder and right upper chest wall pain. Onset was 2 days ago.  Initially patient does not recall precipitant, but eventually she notes that she was shoveling the day prior to the onset of symptoms. Initially she describes the pain is sore, severe, in the superior anterior shoulder and chest wall.  Pain is nonradiating, worse with activity, not pleuritic or exertional. No left-sided chest wall pain, no posterior neck pain, no fever, no cough, chills. No relief with OTC medication.    Past Medical History:  Diagnosis Date  . Allergy   . Anemia   . Diabetes mellitus   . Endometriosis   . Hypercholesteremia   . Sickle cell trait [facility])     Patient Active Problem List   Diagnosis Date Noted  . Chest pain 06/11/2019  . Cervical spondylosis with myelopathy and radiculopathy 06/11/2019  . Encounter for screening mammogram for malignant neoplasm of breast 06/11/2019  . Nasal congestion 06/11/2019  . Screening for colon cancer 06/11/2019  . Annual physical exam 06/11/2019  . Controlled type 2 diabetes mellitus with neuropathy (HCC) 06/11/2019  . Hyperlipidemia 06/20/2012  . Diabetes (HCC) 06/20/2012    Past Surgical History:  Procedure Laterality Date  . ABDOMINAL HYSTERECTOMY    . COLON SURGERY    . COLOSTOMY    . COLOSTOMY CLOSURE       OB History   No obstetric history on file.     Family History  Problem Relation Age of Onset  . Hypertension Mother   . Cancer Mother        breast cancer  . Breast cancer Mother 24  . Cancer Father        lung cancer  . Multiple sclerosis Brother   . Heart disease Brother        heart transplant  . Breast cancer Maternal Aunt      Social History   Tobacco Use  . Smoking status: Never Smoker  . Smokeless tobacco: Never Used  Vaping Use  . Vaping Use: Never used  Substance Use Topics  . Alcohol use: No  . Drug use: No    Home Medications Prior to Admission medications   Medication Sig Start Date End Date Taking? Authorizing Provider  aspirin 81 MG chewable tablet Chew 81 mg by mouth daily.    [provider]  Blood Pressure Monitor DEVI 1 Units by Does not apply route daily as needed. 11/02/17   Benjiman Core D, PA-C  Calcium Carbonate (CALCIUM 600 PO) Take by mouth.    [provider]  calcium gluconate 500 MG tablet Take 500 mg by mouth daily. Reported on 06/29/2015    [provider]  cholecalciferol (VITAMIN D) 400 UNITS TABS Take 400 Units by mouth daily.    [provider]  Continuous Blood Gluc Receiver (FREESTYLE LIBRE 14 DAY READER) DEVI 1 Units by Does not apply route daily as needed. 08/18/17   Benjiman Core D, PA-C  Continuous Blood Gluc Sensor (FREESTYLE LIBRE 14 DAY SENSOR) MISC APPLY AS DIRECTED EVERY 14 DAYS 10/08/18   Collie Siad A, MD  fluticasone (FLONASE) 50 MCG/ACT nasal spray SPRAY 2 SPRAYS INTO EACH NOSTRIL EVERY DAY  11/07/18   Doristine Bosworth, MD  gabapentin (NEURONTIN) 100 MG capsule Take 1 capsule (100 mg total) by mouth at bedtime. Patient not taking: Reported on 07/12/2019 05/29/19   Royal Hawthorn, NP  Garlic 2000 MG TBEC Take 1 tablet by mouth daily.     [provider]  glucose blood (FREESTYLE PRECISION NEO TEST) test strip Use as instructed 11/02/17   Benjiman Core D, PA-C  Lancets MISC To check blood sugar daily dx code E11.65 05/15/17   Lezlie Lye, Meda Coffee, MD  lisinopril (ZESTRIL) 5 MG tablet TAKE 1 TABLET BY MOUTH EVERY DAY 08/07/19   Lezlie Lye, Meda Coffee, MD  loratadine-pseudoephedrine (CLARITIN-D 24-HOUR) 10-240 MG 24 hr tablet Take 1 tablet by mouth 2 (two) times daily as needed. Reported on 06/29/2015    [provider]  metFORMIN (GLUCOPHAGE-XR) 500 MG 24 hr tablet Take 1 tablet (500 mg total) by mouth daily with breakfast. 06/25/19   Doristine Bosworth, MD  metoprolol tartrate (LOPRESSOR) 100 MG tablet Take 1 tablet (100 mg total) by mouth once for 1 dose. Take 1 tablet (100 mg tablet) two hours prior to CT scan 06/18/19 06/18/19  Quintella Reichert, MD  ONE TOUCH ULTRA TEST test strip 1 EACH BY OTHER ROUTE DAILY. DX E11.65 11/07/17   Benjiman Core D, PA-C  pravastatin (PRAVACHOL) 40 MG tablet TAKE 1 TABLET BY MOUTH EVERY DAY 08/10/19   Bacigalupo, Marzella Schlein, MD    Allergies    Cephalexin, Amoxicillin, Hyoscyamine sulfate, and Sulfa antibiotics  Review of Systems   Review of Systems  Constitutional:       Per HPI, otherwise negative  HENT:       Per HPI, otherwise negative  Respiratory:       Per HPI, otherwise negative  Cardiovascular:       Per HPI, otherwise negative  Gastrointestinal: Negative for vomiting.  Endocrine:       Negative aside from HPI  Genitourinary:       Neg aside from HPI   Musculoskeletal:       Per HPI, otherwise negative  Skin: Negative.   Neurological: Negative for syncope.    Physical Exam Updated Vital Signs BP (!) 143/67 (BP Location: Right Arm)   Pulse 88   Temp 98.3 F (36.8 C) (Oral)   Resp 18   SpO2 99%   Physical Exam Vitals and nursing note reviewed.  Constitutional:      General: She is not in acute distress.    Appearance: She is well-developed and well-nourished.  HENT:     Head: Normocephalic and atraumatic.  Eyes:     Extraocular Movements: EOM normal.     Conjunctiva/sclera: Conjunctivae normal.  Cardiovascular:     Rate and Rhythm: Normal rate and regular rhythm.  Pulmonary:     Effort: Pulmonary effort is normal. No respiratory distress.     Breath sounds: Normal breath sounds. No stridor.  Abdominal:     General: There is no distension.  Musculoskeletal:        General: No edema.     Right shoulder: Tenderness and bony  tenderness present. Normal range of motion.     Left shoulder: Normal.       Arms:  Skin:    General: Skin is warm and dry.  Neurological:     Mental Status: She is alert and oriented to person, place, and time.     Cranial Nerves: No cranial nerve deficit.  Psychiatric:  Mood and Affect: Mood and affect normal.     ED Results / Procedures / Treatments   Labs (all labs ordered are listed, but only abnormal results are displayed) Labs Reviewed  BASIC METABOLIC PANEL - Abnormal; Notable for the following components:      Result Value   Glucose, Bld 162 (*)    All other components within normal limits  CBC  TROPONIN I (HIGH SENSITIVITY)  TROPONIN I (HIGH SENSITIVITY)    EKG None  Radiology DG Chest 2 View  Result Date: 03/21/2020 CLINICAL DATA:  Right-sided chest pain and shoulder pain. EXAM: CHEST - 2 VIEW COMPARISON:  CT dated Jul 12, 2019 FINDINGS: There is a pulmonary nodule projecting over the left lung apex. No pneumothorax or large pleural effusion. There is no definite acute osseous abnormality. No focal infiltrate. The heart size is unremarkable. IMPRESSION: 1. No acute cardiopulmonary process. 2. Left upper lobe pulmonary nodule. Follow-up with an outpatient CT is recommended for further evaluation of this finding. Electronically Signed   By: Katherine Mantle M.D.   On: 03/21/2020 16:37    Procedures Procedures   Medications Ordered in ED Medications - No data to display  ED Course  I have reviewed the triage vital signs and the nursing notes.  Pertinent labs & imaging results that were available during my care of the patient were reviewed by me and considered in my medical decision making (see chart for details).  On repeat exam patient is awake, alert, in no distress. I reviewed the x-ray, discussed all findings with her.  X-ray notable for left lobe pulmonary nodule, patient notes that she has previously been told of adenopathy, is working on  outpatient follow-up.  She and I discussed the importance of pursuing this follow-up, and she was provided additional resources to do so. Here, with no dyspnea, no left-sided chest pain, no evidence for pneumonia, ACS, suspicion for musculoskeletal etiology, patient was started on appropriate anti-inflammatories, discharged in stable condition. Final Clinical Impression(s) / ED Diagnoses Final diagnoses:  Acute pain of right shoulder  Right-sided chest wall pain      Gerhard Munch, MD 03/21/20 2031

## 2020-03-21 NOTE — ED Notes (Signed)
Gone to car coming back

## 2020-03-21 NOTE — Discharge Instructions (Addendum)
As discussed, today's evaluation has been generally reassuring.  Your shoulder pain is likely due to inflammation.  However, your x-ray was consistent with your prior 1, and does require follow-up.  This will a CT scan of your chest.  If you are unable to arrange this with your cardiologist, please follow-up at our community care clinic. For pain relief of your shoulder and chest wall pain, please use Naprosyn, 500 mg, twice daily with food, and topical medication patches, placed on the affected area, every 12 hours for the next 5 days.  Return for concerning changes in your condition.

## 2020-03-21 NOTE — ED Notes (Signed)
[facility]

## 2020-03-21 NOTE — ED Notes (Signed)
Pt back in waiting room 

## 2020-03-25 ENCOUNTER — Other Ambulatory Visit: Payer: Self-pay | Admitting: Physician Assistant

## 2020-03-25 ENCOUNTER — Other Ambulatory Visit (HOSPITAL_COMMUNITY): Payer: Self-pay | Admitting: Physician Assistant

## 2020-03-25 DIAGNOSIS — R590 Localized enlarged lymph nodes: Secondary | ICD-10-CM

## 2020-04-01 ENCOUNTER — Encounter: Payer: Self-pay | Admitting: Family Medicine

## 2020-04-01 ENCOUNTER — Other Ambulatory Visit: Payer: Self-pay

## 2020-04-01 ENCOUNTER — Ambulatory Visit: Payer: Federal, State, Local not specified - PPO | Admitting: Family Medicine

## 2020-04-01 ENCOUNTER — Telehealth: Payer: Self-pay | Admitting: Family Medicine

## 2020-04-01 VITALS — BP 138/84 | HR 74 | Temp 98.5°F | Ht 62.0 in | Wt 147.0 lb

## 2020-04-01 DIAGNOSIS — R911 Solitary pulmonary nodule: Secondary | ICD-10-CM

## 2020-04-01 DIAGNOSIS — Z1211 Encounter for screening for malignant neoplasm of colon: Secondary | ICD-10-CM

## 2020-04-01 DIAGNOSIS — E1140 Type 2 diabetes mellitus with diabetic neuropathy, unspecified: Secondary | ICD-10-CM

## 2020-04-01 DIAGNOSIS — E119 Type 2 diabetes mellitus without complications: Secondary | ICD-10-CM

## 2020-04-01 DIAGNOSIS — E7800 Pure hypercholesterolemia, unspecified: Secondary | ICD-10-CM | POA: Diagnosis not present

## 2020-04-01 MED ORDER — METFORMIN HCL ER 500 MG PO TB24: 500.0000 mg | ORAL_TABLET | Freq: Every day | ORAL | 3 refills | Status: DC

## 2020-04-01 NOTE — Telephone Encounter (Signed)
04/01/2020 - I RECEIVED THIS CALL FROM THE PATIENT. SHE SAID THE [facility] JUST CALLED HER TO SAY THAT 2 PROVIDERS HAVE ORDERED A CT SCAN OF HER LUNGS. Kelly Fitzpatrick ORDERED IT WITHOUT CONTRAST AND Kelly Fitzpatrick AT [facility] ORDERED IT WITH CONTRAST. SHE SEES Kelly Fitzpatrick FOR HER DIABETES. SHE WAS TOLD THE CT SCAN CAN BE DONE EITHER WAY BUT SHE DOES NOT KNOW WHICH WAY IS BEST? SHE WOULD LIKE Kelly Fitzpatrick TO TELL HER THE DIFFERENCE BETWEEN THE TWO. BEST PHONE FOR PATIENT IS: [phone] -[facility] (IF NO ANSWER) [phone] (CELL) [facility]BC

## 2020-04-01 NOTE — Progress Notes (Signed)
2/9/202211:52 AM  Kelly Fitzpatrick November 07, 1960, 60 y.o., female [id number]  Chief Complaint  Patient presents with  . follow up CT scan from 07/12/2019    Lymph nodes had preciously showed up in CT scan 6 month follow up needed     HPI:   Patient is a 60 y.o. female with past medical history significant for DM, HLD who presents today for CT follow up  Last A1c October: 6.5 Metformin 500mg  daily Has been doing follow ups with urgent care since Dr. left Takes her A1c at home  Had two scans 5/21 and 1/22 where it was suggested to have outpatient CT follow up for nodules  03/21/20:EXAM: CHEST - 2 VIEW COMPARISON:  CT dated Jul 12, 2019 FINDINGS: There is a pulmonary nodule projecting over the left lung apex. No pneumothorax or large pleural effusion. There is no definite acute osseous abnormality. No focal infiltrate. The heart size is unremarkable. IMPRESSION: 1. No acute cardiopulmonary process. 2. Left upper lobe pulmonary nodule. Follow-up with an outpatient CT is recommended for further evaluation of this finding. Electronically Signed   By: Jul 14, 2019 M.D.   On: 03/21/2020 16:37   Chest CT 5/21  FINDINGS: Vascular: Aortic atherosclerosis. No central pulmonary embolism, on this non-dedicated study.  Mediastinum/Nodes: 7 mm prevascular node on 01/11. Right paratracheal node of 1.2 cm on 01/11.  Right hilar node of 1.3 cm on 08/11.  Periesophageal node of 6 mm.  Lungs/Pleura: No pleural fluid.  Clear imaged lungs.  Upper Abdomen: Normal imaged portions of the liver, spleen, stomach  Musculoskeletal: No acute osseous abnormality.  IMPRESSION: 1. Mild thoracic adenopathy, incompletely imaged. Consider dedicated diagnostic chest CT to serve as a baseline for follow-up. 2. Otherwise, no acute process in the imaged chest. 3.  Aortic Atherosclerosis (ICD10-I70.0).    Depression screen [facility] 2/9 04/01/2020 05/29/2019 05/22/2018  Decreased Interest 0 0 0   Down, Depressed, Hopeless 0 0 0  PHQ - 2 Score 0 0 0  Altered sleeping - - -  Tired, decreased energy - - -  Change in appetite - - -  Feeling bad or failure about yourself  - - -  Trouble concentrating - - -  Moving slowly or fidgety/restless - - -  Suicidal thoughts - - -  PHQ-9 Score - - -  Difficult doing work/chores - - -    Fall Risk  04/01/2020 05/29/2019 05/22/2018 05/11/2018 11/02/2017  Falls in the past year? 0 0 0 0 No  Number falls in past yr: 0 0 0 - -  Injury with Fall? 0 0 0 - -  Follow up Falls evaluation completed Falls evaluation completed Falls evaluation completed - -     Allergies  Allergen Reactions  . Cephalexin Other (See Comments)    unknown  . Amoxicillin Rash  . Hyoscyamine Sulfate Rash  . Sulfa Antibiotics Rash    Prior to Admission medications   Medication Sig Start Date End Date Taking? Authorizing Provider  aspirin 81 MG chewable tablet Chew 81 mg by mouth daily.   Yes [provider]  Blood Pressure Monitor DEVI 1 Units by Does not apply route daily as needed. 11/02/17  Yes 01/02/18, Barnett Abu D, PA-C  Calcium Carbonate (CALCIUM 600 PO) Take by mouth.   Yes [provider]  calcium gluconate 500 MG tablet Take 500 mg by mouth daily. Reported on 06/29/2015   Yes [provider]  cholecalciferol (VITAMIN D) 400 UNITS TABS Take 400 Units by mouth  daily.   Yes [provider]  fluticasone (FLONASE) 50 MCG/ACT nasal spray SPRAY 2 SPRAYS INTO EACH NOSTRIL EVERY DAY 11/07/18  Yes Stallings, Zoe A, MD  gabapentin (NEURONTIN) 100 MG capsule Take 1 capsule (100 mg total) by mouth at bedtime. 05/29/19  Yes Royal Hawthorn, NP  Garlic 2000 MG TBEC Take 1 tablet by mouth daily.    Yes [provider]  glucose blood (FREESTYLE PRECISION NEO TEST) test strip Use as instructed 11/02/17  Yes Barnett Abu, [place] D, PA-C  Lancets MISC To check blood sugar daily dx code E11.65 05/15/17  Yes Lezlie Lye, Meda Coffee, MD  lisinopril  (ZESTRIL) 5 MG tablet TAKE 1 TABLET BY MOUTH EVERY DAY 08/07/19  Yes Lezlie Lye, Meda Coffee, MD  loratadine-pseudoephedrine (CLARITIN-D 12-HOUR) 5-120 MG tablet Take 1 tablet by mouth 2 (two) times daily.   Yes [provider]  metFORMIN (GLUCOPHAGE-XR) 500 MG 24 hr tablet Take 1 tablet (500 mg total) by mouth daily with breakfast. 06/25/19  Yes Doristine Bosworth, MD  NON FORMULARY [place] given by [employer] finger stick reader   Yes [provider]  ONE TOUCH ULTRA TEST test strip 1 EACH BY OTHER ROUTE DAILY. DX E11.65 11/07/17  Yes Barnett Abu, [place] D, PA-C  pravastatin (PRAVACHOL) 40 MG tablet TAKE 1 TABLET BY MOUTH EVERY DAY 08/10/19  Yes Bacigalupo, Marzella Schlein, MD    Past Medical History:  Diagnosis Date  . Allergy   . Anemia   . Diabetes mellitus   . Endometriosis   . Hypercholesteremia   . Sickle cell trait [facility])     Past Surgical History:  Procedure Laterality Date  . ABDOMINAL HYSTERECTOMY    . COLON SURGERY    . COLOSTOMY    . COLOSTOMY CLOSURE      Social History   Tobacco Use  . Smoking status: Never Smoker  . Smokeless tobacco: Never Used  Substance Use Topics  . Alcohol use: No    Family History  Problem Relation Age of Onset  . Hypertension Mother   . Cancer Mother        breast cancer  . Breast cancer Mother 65  . Cancer Father        lung cancer  . Multiple sclerosis Brother   . Heart disease Brother        heart transplant  . Breast cancer Maternal Aunt     Review of Systems  Constitutional: Negative for chills, fever and malaise/fatigue.  Eyes: Negative for blurred vision and double vision.  Respiratory: Negative for cough, hemoptysis, sputum production, shortness of breath and wheezing.   Cardiovascular: Negative for chest pain, palpitations and leg swelling.  Gastrointestinal: Negative for abdominal pain, blood in stool, constipation, diarrhea, heartburn, nausea and vomiting.  Genitourinary: Negative for dysuria, frequency and hematuria.   Musculoskeletal: Negative for back pain and joint pain.  Skin: Negative for rash.  Neurological: Negative for dizziness, weakness and headaches.     OBJECTIVE:  Today's Vitals   04/01/20 1102  BP: 138/84  Pulse: 74  Temp: 98.5 F (36.9 C)  SpO2: 100%  Weight: 147 lb (66.7 kg)  Height: 5\' 2"  (1.575 m)   Body mass index is 26.89 kg/m.   Physical Exam Constitutional:      General: She is not in acute distress.    Appearance: Normal appearance. She is not ill-appearing.  HENT:     Head: Normocephalic.  Cardiovascular:     Rate and Rhythm: Normal rate and regular rhythm.  Pulses: Normal pulses.     Heart sounds: Normal heart sounds. No murmur heard. No friction rub. No gallop.   Pulmonary:     Effort: Pulmonary effort is normal. No respiratory distress.     Breath sounds: Normal breath sounds. No stridor. No wheezing, rhonchi or rales.  Abdominal:     General: Bowel sounds are normal.     Palpations: Abdomen is soft.     Tenderness: There is no abdominal tenderness.  Musculoskeletal:     Right lower leg: No edema.     Left lower leg: No edema.  Skin:    General: Skin is warm and dry.  Neurological:     Mental Status: She is alert and oriented to person, place, and time.  Psychiatric:        Mood and Affect: Mood normal.        Behavior: Behavior normal.     No results found for this or any previous visit (from the past 24 hour(s)).  No results found.   ASSESSMENT and PLAN  Problem List Items Addressed This Visit      Endocrine   Diabetes ([place])   Relevant Medications   metFORMIN (GLUCOPHAGE-XR) 500 MG 24 hr tablet   Other Relevant Orders   Hemoglobin A1c   CMP14+EGFR   TSH   Vitamin D, 25-hydroxy   Controlled type 2 diabetes mellitus with neuropathy (HCC)   Relevant Medications   metFORMIN (GLUCOPHAGE-XR) 500 MG 24 hr tablet     Other   Hyperlipidemia - Primary   Relevant Orders   Lipid Panel   Screening for colon cancer   Relevant Orders    Cologuard    Other Visit Diagnoses    Solitary pulmonary nodule on lung CT       Relevant Orders   CT CHEST NODULE FOLLOW UP LOW DOSE W/O   CBC with Differential      Plan . Will follow up with labs . Will follow up with lung nodules and Ct . Refills sent as needed . Order for cologuard sent   Return in about 3 months (around 06/29/2020).    Huston Foley Cicely Ortner, FNP-BC [facility] [place], [place] [address] Ph.  [phone] Fax [phone]

## 2020-04-01 NOTE — Telephone Encounter (Signed)
04/01/2020 - I RECEIVED A CALL FROM CRYSTAL FROM [facility]. SHE NEEDS A NEW ORDER FROM [facility]. CT TEST WITHOUT BECAUSE LUNG NODULE FOLLOW-UP GOES WITH THE LUNG CANCER SCREENING. CT TEST WITH OR WITHOUT - WHICH EVER SHE PREFERS. IT CAN NOT BE BOTH. CRYSTAL'S PHONE IS: [phone] EXT. [phone]  MBC

## 2020-04-01 NOTE — Patient Instructions (Addendum)
  Health Maintenance, Female Adopting a healthy lifestyle and getting preventive care are important in promoting health and wellness. Ask your health care provider about:  The right schedule for you to have regular tests and exams.  Things you can do on your own to prevent diseases and keep yourself healthy. What should I know about diet, weight, and exercise? Eat a healthy diet  Eat a diet that includes plenty of vegetables, fruits, low-fat dairy products, and lean protein.  Do not eat a lot of foods that are high in solid fats, added sugars, or sodium.   Maintain a healthy weight Body mass index (BMI) is used to identify weight problems. It estimates body fat based on height and weight. Your health care provider can help determine your BMI and help you achieve or maintain a healthy weight. Get regular exercise Get regular exercise. This is one of the most important things you can do for your health. Most adults should:  Exercise for at least 150 minutes each week. The exercise should increase your heart rate and make you sweat (moderate-intensity exercise).  Do strengthening exercises at least twice a week. This is in addition to the moderate-intensity exercise.  Spend less time sitting. Even light physical activity can be beneficial. Watch cholesterol and blood lipids Have your blood tested for lipids and cholesterol at 62 years of age, then have this test every 5 years. Have your cholesterol levels checked more often if:  Your lipid or cholesterol levels are high.  You are older than 62 years of age.  You are at high risk for heart disease. What should I know about cancer screening? Depending on your health history and family history, you may need to have cancer screening at various ages. This may include screening for:  Breast cancer.  Cervical cancer.  Colorectal cancer.  Skin cancer.  Lung cancer. What should I know about heart disease, diabetes, and high blood  pressure? Blood pressure and heart disease  High blood pressure causes heart disease and increases the risk of stroke. This is more likely to develop in people who have high blood pressure readings, are of African descent, or are overweight.  Have your blood pressure checked: ? Every 3-5 years if you are 18-39 years of age. ? Every year if you are 40 years old or older. Diabetes Have regular diabetes screenings. This checks your fasting blood sugar level. Have the screening done:  Once every three years after age 40 if you are at a normal weight and have a low risk for diabetes.  More often and at a younger age if you are overweight or have a high risk for diabetes. What should I know about preventing infection? Hepatitis B If you have a higher risk for hepatitis B, you should be screened for this virus. Talk with your health care provider to find out if you are at risk for hepatitis B infection. Hepatitis C Testing is recommended for:  Everyone born from 1945 through 1965.  Anyone with known risk factors for hepatitis C. Sexually transmitted infections (STIs)  Get screened for STIs, including gonorrhea and chlamydia, if: ? You are sexually active and are younger than 62 years of age. ? You are older than 62 years of age and your health care provider tells you that you are at risk for this type of infection. ? Your sexual activity has changed since you were last screened, and you are at increased risk for chlamydia or gonorrhea. Ask your health   care provider if you are at risk.  Ask your health care provider about whether you are at high risk for HIV. Your health care provider may recommend a prescription medicine to help prevent HIV infection. If you choose to take medicine to prevent HIV, you should first get tested for HIV. You should then be tested every 3 months for as long as you are taking the medicine. Pregnancy  If you are about to stop having your period (premenopausal) and  you may become pregnant, seek counseling before you get pregnant.  Take 400 to 800 micrograms (mcg) of folic acid every day if you become pregnant.  Ask for birth control (contraception) if you want to prevent pregnancy. Osteoporosis and menopause Osteoporosis is a disease in which the bones lose minerals and strength with aging. This can result in bone fractures. If you are 65 years old or older, or if you are at risk for osteoporosis and fractures, ask your health care provider if you should:  Be screened for bone loss.  Take a calcium or vitamin D supplement to lower your risk of fractures.  Be given hormone replacement therapy (HRT) to treat symptoms of menopause. Follow these instructions at home: Lifestyle  Do not use any products that contain nicotine or tobacco, such as cigarettes, e-cigarettes, and chewing tobacco. If you need help quitting, ask your health care provider.  Do not use street drugs.  Do not share needles.  Ask your health care provider for help if you need support or information about quitting drugs. Alcohol use  Do not drink alcohol if: ? Your health care provider tells you not to drink. ? You are pregnant, may be pregnant, or are planning to become pregnant.  If you drink alcohol: ? Limit how much you use to 0-1 drink a day. ? Limit intake if you are breastfeeding.  Be aware of how much alcohol is in your drink. In the [place] one drink equals one 12 oz bottle of beer (355 mL), one 5 oz glass of wine (148 mL), or one 1 oz glass of hard liquor (44 mL). General instructions  Schedule regular health, dental, and eye exams.  Stay current with your vaccines.  Tell your health care provider if: ? You often feel depressed. ? You have ever been abused or do not feel safe at home. Summary  Adopting a healthy lifestyle and getting preventive care are important in promoting health and wellness.  Follow your health care provider's instructions about healthy  diet, exercising, and getting tested or screened for diseases.  Follow your health care provider's instructions on monitoring your cholesterol and blood pressure. This information is not intended to replace advice given to you by your health care provider. Make sure you discuss any questions you have with your health care provider. Document Revised: 01/31/2018 Document Reviewed: 01/31/2018 Elsevier Patient Education  2021 Elsevier Inc.   If you have lab work done today you will be contacted with your lab results within the next 2 weeks.  If you have not heard from us then please contact us. The fastest way to get your results is to register for My Chart.   IF you received an x-ray today, you will receive an invoice from [facility]. Please contact [facility] at [phone] with questions or concerns regarding your invoice.   IF you received labwork today, you will receive an invoice from [facility]. Please contact [facility] at [phone] with questions or concerns regarding your invoice.   Our billing   staff will not be able to assist you with questions regarding bills from these companies.  You will be contacted with the lab results as soon as they are available. The fastest way to get your results is to activate your My Chart account. Instructions are located on the last page of this paperwork. If you have not heard from us regarding the results in 2 weeks, please contact this office.      

## 2020-04-02 ENCOUNTER — Other Ambulatory Visit: Payer: Self-pay | Admitting: Family Medicine

## 2020-04-02 DIAGNOSIS — E119 Type 2 diabetes mellitus without complications: Secondary | ICD-10-CM

## 2020-04-02 DIAGNOSIS — R911 Solitary pulmonary nodule: Secondary | ICD-10-CM

## 2020-04-02 LAB — CMP14+EGFR
ALT: 6 IU/L (ref 0–32)
AST: 13 IU/L (ref 0–40)
Albumin/Globulin Ratio: 2.1 (ref 1.2–2.2)
Albumin: 4.6 g/dL (ref 3.8–4.8)
Alkaline Phosphatase: 70 IU/L (ref 44–121)
BUN/Creatinine Ratio: 13 (ref 12–28)
BUN: 11 mg/dL (ref 8–27)
Bilirubin Total: 0.4 mg/dL (ref 0.0–1.2)
CO2: 19 mmol/L — ABNORMAL LOW (ref 20–29)
Calcium: 10.2 mg/dL (ref 8.7–10.3)
Chloride: 101 mmol/L (ref 96–106)
Creatinine, Ser: 0.82 mg/dL (ref 0.57–1.00)
GFR calc Af Amer: 89 mL/min/{1.73_m2} (ref >59)
GFR calc non Af Amer: 77 mL/min/{1.73_m2} (ref >59)
Globulin, Total: 2.2 g/dL (ref 1.5–4.5)
Glucose: 107 mg/dL — ABNORMAL HIGH (ref 65–99)
Potassium: 4.2 mmol/L (ref 3.5–5.2)
Sodium: 139 mmol/L (ref 134–144)
Total Protein: 6.8 g/dL (ref 6.0–8.5)

## 2020-04-02 LAB — CBC WITH DIFFERENTIAL/PLATELET
Basophils Absolute: 0 10*3/uL (ref 0.0–0.2)
Basos: 1 %
EOS (ABSOLUTE): 0 10*3/uL (ref 0.0–0.4)
Eos: 0 %
Hematocrit: 39.9 % (ref 34.0–46.6)
Hemoglobin: 13.4 g/dL (ref 11.1–15.9)
Immature Grans (Abs): 0 10*3/uL (ref 0.0–0.1)
Immature Granulocytes: 0 %
Lymphocytes Absolute: 0.8 10*3/uL (ref 0.7–3.1)
Lymphs: 13 %
MCH: 27.7 pg (ref 26.6–33.0)
MCHC: 33.6 g/dL (ref 31.5–35.7)
MCV: 83 fL (ref 79–97)
Monocytes Absolute: 0.5 10*3/uL (ref 0.1–0.9)
Monocytes: 9 %
Neutrophils Absolute: 4.3 10*3/uL (ref 1.4–7.0)
Neutrophils: 77 %
Platelets: 200 10*3/uL (ref 150–450)
RBC: 4.83 x10E6/uL (ref 3.77–5.28)
RDW: 14.4 % (ref 11.7–15.4)
WBC: 5.6 10*3/uL (ref 3.4–10.8)

## 2020-04-02 LAB — LIPID PANEL
Chol/HDL Ratio: 2.6 ratio (ref 0.0–4.4)
Cholesterol, Total: 151 mg/dL (ref 100–199)
HDL: 57 mg/dL (ref >39)
LDL Chol Calc (NIH): 84 mg/dL (ref 0–99)
Triglycerides: 48 mg/dL (ref 0–149)
VLDL Cholesterol Cal: 10 mg/dL (ref 5–40)

## 2020-04-02 LAB — VITAMIN D 25 HYDROXY (VIT D DEFICIENCY, FRACTURES): Vit D, 25-Hydroxy: 41.1 ng/mL (ref 30.0–100.0)

## 2020-04-02 LAB — HEMOGLOBIN A1C
Est. average glucose Bld gHb Est-mCnc: 154 mg/dL
Hgb A1c MFr Bld: 7 % — ABNORMAL HIGH (ref 4.8–5.6)

## 2020-04-02 LAB — TSH: TSH: 1.28 u[IU]/mL (ref 0.450–4.500)

## 2020-04-02 MED ORDER — METFORMIN HCL ER 500 MG PO TB24
1000.0000 mg | ORAL_TABLET | Freq: Every day | ORAL | 3 refills | Status: DC
Start: 2020-04-02 — End: 2020-06-22

## 2020-04-08 ENCOUNTER — Ambulatory Visit
Admission: RE | Admit: 2020-04-08 | Discharge: 2020-04-08 | Disposition: A | Discharge status: Home / Self Care | Payer: Federal, State, Local not specified - PPO | Source: Ambulatory Visit | Attending: Family Medicine | Admitting: Family Medicine

## 2020-04-08 ENCOUNTER — Other Ambulatory Visit: Payer: Self-pay

## 2020-04-08 DIAGNOSIS — R911 Solitary pulmonary nodule: Secondary | ICD-10-CM

## 2020-04-10 ENCOUNTER — Other Ambulatory Visit: Payer: Self-pay | Admitting: Family Medicine

## 2020-04-10 DIAGNOSIS — R911 Solitary pulmonary nodule: Secondary | ICD-10-CM

## 2020-04-10 NOTE — Progress Notes (Signed)
If you could let her know I am going to have her follow up with pulmonology to discuss the CT findings. I have placed the referral and they will be calling her.

## 2020-04-20 ENCOUNTER — Encounter: Payer: Self-pay | Admitting: Emergency Medicine

## 2020-04-20 ENCOUNTER — Ambulatory Visit: Payer: Federal, State, Local not specified - PPO | Admitting: Emergency Medicine

## 2020-04-20 ENCOUNTER — Other Ambulatory Visit: Payer: Self-pay

## 2020-04-20 VITALS — BP 130/80 | HR 88 | Temp 97.2°F | Ht 62.0 in | Wt 146.4 lb

## 2020-04-20 DIAGNOSIS — R911 Solitary pulmonary nodule: Secondary | ICD-10-CM

## 2020-04-20 DIAGNOSIS — R590 Localized enlarged lymph nodes: Secondary | ICD-10-CM | POA: Diagnosis not present

## 2020-04-20 NOTE — Patient Instructions (Addendum)
We discussed your CT scan of the chest today.  There is some lymph node enlargement that has not changed compared with May.  This may reflect inflammatory process such as sarcoidosis. We will do lab work today. We will plan to repeat your CT scan of the chest with contrast in February 2022 to follow lymph nodes Follow Dr. Delton Coombes in about 1 month.  At that time we can talk about whether we should do bronchoscopy or other work-up of your enlarged lymph nodes

## 2020-04-20 NOTE — Progress Notes (Signed)
Subjective:    Patient ID: Kelly Fitzpatrick, female    DOB: 10/26/1960, 60 y.o.   MRN: [id number]  HPI 60 year old never smoker with a history of endometriosis, diabetes, hypercholesterolemia, sickle cell trait.  She is referred today for evaluation of lymphadenopathy on CT scan of the chest.   CT chest 04/08/2020 reviewed by me, shows mediastinal and hilar lymphadenopathy, 7 mm prevascular node, 13 mm right paratracheal node, 10 mm subcarinal node, mild right hilar lymphadenopathy.  There is a 6 mm right upper lobe subpleural calcified granuloma without any other nodules present.  All relatively stable compared with 07/12/2019 cardiac CT.  Denies any cough, wheeze, dyspnea. She is having night sweats for the last week. No fever. No wt loss. She has had some palmar itching without rash over the last months, now resolved.   Father died of lung cancer   Review of Systems As per HPI  Past Medical History:  Diagnosis Date  . Allergy   . Anemia   . Diabetes mellitus   . Endometriosis   . Hypercholesteremia   . Sickle cell trait (HCC)      Family History  Problem Relation Age of Onset  . Hypertension Mother   . Cancer Mother        breast cancer  . Breast cancer Mother 47  . Cancer Father        lung cancer  . Multiple sclerosis Brother   . Heart disease Brother        heart transplant  . Breast cancer Maternal Aunt      Social History   Socioeconomic History  . Marital status: Single    Spouse name: Not on file  . Number of children: 0  . Years of education: Not on file  . Highest education level: Not on file  Occupational History  . Not on file  Tobacco Use  . Smoking status: Never Smoker  . Smokeless tobacco: Never Used  Vaping Use  . Vaping Use: Never used  Substance and Sexual Activity  . Alcohol use: No  . Drug use: No  . Sexual activity: Not Currently    Birth control/protection: Abstinence  Other Topics Concern  . Not on file  Social History Narrative    She is from [place], [place]. Lived in [place] for 29 years. 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 Single, no children. Not currently sexually active.    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 Cephalexin Other (See Comments)    unknown  . Amoxicillin Rash  . Hyoscyamine Sulfate Rash  . Sulfa Antibiotics Rash     Outpatient Medications Prior to Visit  Medication Sig Dispense Refill  . aspirin 81 MG chewable tablet Chew 81 mg by mouth daily.    . Blood Pressure Monitor DEVI 1 Units by Does not apply route daily as needed. 1 Device 0  . Calcium Carbonate (CALCIUM 600 PO) Take by mouth.    . calcium gluconate 500 MG tablet Take 500 mg by mouth daily. Reported on 06/29/2015    . cholecalciferol (VITAMIN D) 400 UNITS TABS Take 400 Units by mouth daily.    Marland Kitchen  fluticasone (FLONASE) 50 MCG/ACT nasal spray SPRAY 2 SPRAYS INTO EACH NOSTRIL EVERY DAY 48 mL 2  . Garlic 2000 MG TBEC Take 1 tablet by mouth daily.     Marland Kitchen glucose blood (FREESTYLE PRECISION NEO TEST) test strip Use as instructed 50 each 2  . Lancets MISC To check blood sugar daily dx code E11.65 100 each 0  . lisinopril (ZESTRIL) 5 MG tablet TAKE 1 TABLET BY MOUTH EVERY DAY 90 tablet 0  . loratadine-pseudoephedrine (CLARITIN-D 12-HOUR) 5-120 MG tablet Take 1 tablet by mouth 2 (two) times daily.    . metFORMIN (GLUCOPHAGE-XR) 500 MG 24 hr tablet Take 2 tablets (1,000 mg total) by mouth daily with breakfast. 90 tablet 3  . NON FORMULARY [place] given by [employer]BS finger stick reader    . ONE TOUCH ULTRA TEST test strip 1 EACH BY OTHER ROUTE DAILY. DX E11.65 100 each 0  . pravastatin (PRAVACHOL) 40 MG tablet TAKE 1 TABLET BY  MOUTH EVERY DAY 90 tablet 2  . gabapentin (NEURONTIN) 100 MG capsule Take 1 capsule (100 mg total) by mouth at bedtime. (Patient not taking: Reported on 04/20/2020) 30 capsule 3   No facility-administered medications prior to visit.         Objective:   Physical Exam Vitals:   04/20/20 1408  BP: 130/80  Pulse: 88  Temp: (!) 97.2 F (36.2 C)  TempSrc: Temporal  SpO2: 100%  Weight: 146 lb 6.4 oz (66.4 kg)  Height: 5\' 2"  (1.575 m)   Gen: Pleasant, well-nourished, in no distress,  normal affect  ENT: No lesions,  mouth clear,  oropharynx clear, no postnasal drip  Neck: No JVD, no stridor  Lungs: No use of accessory muscles, no crackles or wheezing on normal respiration, no wheeze on forced expiration  Cardiovascular: RRR, heart sounds normal, no murmur or gallops, no peripheral edema  Musculoskeletal: No deformities, no cyanosis or clubbing  Neuro: alert, awake, non focal  Skin: Warm, no lesions or rash     Assessment & Plan:  Mediastinal adenopathy Adenopathy on CT chest 04/08/2020, stable compared with the prior cardiac CT from May 2021.  This is reassuring, argues against a progressive process such as lymphoma.  Do suspect possible sarcoidosis.  Check ACE level today.  We will plan to repeat her CT scan of the chest in 03/2020.  Depending on interval change, stability we may discuss bronchoscopy with EBUS for nodal sampling.  We discussed your CT scan of the chest today.  There is some lymph node enlargement that has not changed compared with May.  This may reflect inflammatory process such as sarcoidosis. We will do lab work today. We will plan to repeat your CT scan of the chest with contrast in February 2022 to follow lymph nodes Follow Dr. March 2022 in about 1 month.  At that time we can talk about whether we should do bronchoscopy or other work-up of your enlarged lymph nodes   Delton Coombes, MD, PhD 04/23/2020, 3:46 PM [facility] [phone] or  if no answer before 7:00PM call [phone] For any issues after 7:00PM please call eLink [phone]

## 2020-04-22 LAB — ANGIOTENSIN CONVERTING ENZYME: Angiotensin-Converting Enzyme: <5 U/L — ABNORMAL LOW (ref 9–67)

## 2020-04-23 ENCOUNTER — Encounter: Payer: Self-pay | Admitting: Emergency Medicine

## 2020-04-23 DIAGNOSIS — R590 Localized enlarged lymph nodes: Secondary | ICD-10-CM | POA: Insufficient documentation

## 2020-04-23 DIAGNOSIS — D869 Sarcoidosis, unspecified: Secondary | ICD-10-CM | POA: Insufficient documentation

## 2020-04-23 NOTE — Assessment & Plan Note (Signed)
Adenopathy on CT chest 04/08/2020, stable compared with the prior cardiac CT from May 2021.  This is reassuring, argues against a progressive process such as lymphoma.  Do suspect possible sarcoidosis.  Check ACE level today.  We will plan to repeat her CT scan of the chest in 03/2020.  Depending on interval change, stability we may discuss bronchoscopy with EBUS for nodal sampling.  We discussed your CT scan of the chest today.  There is some lymph node enlargement that has not changed compared with May.  This may reflect inflammatory process such as sarcoidosis. We will do lab work today. We will plan to repeat your CT scan of the chest with contrast in February 2022 to follow lymph nodes Follow Dr. Delton Coombes in about 1 month.  At that time we can talk about whether we should do bronchoscopy or other work-up of your enlarged lymph nodes

## 2020-06-15 ENCOUNTER — Telehealth: Payer: Self-pay | Admitting: Emergency Medicine

## 2020-06-15 DIAGNOSIS — R590 Localized enlarged lymph nodes: Secondary | ICD-10-CM

## 2020-06-15 NOTE — Telephone Encounter (Signed)
Called spoke with patient let her know we needed to get dates from Dr. Delton Coombes first then we would call her with those dates and times and locations for the procedure and covid testing. She verbalized understanding.   Will route to Dr.Byrum for order and dates to schedule

## 2020-06-16 ENCOUNTER — Telehealth: Payer: Self-pay

## 2020-06-16 DIAGNOSIS — R911 Solitary pulmonary nodule: Secondary | ICD-10-CM

## 2020-06-16 NOTE — Telephone Encounter (Signed)
I spoke to the patient and she is aware of the EBUS and Covid test she can be reached at [phone] I also told her to come get labs this week

## 2020-06-16 NOTE — Telephone Encounter (Signed)
Agree that she needs bronchoscopy with EBUS to evaluate her mediastinal lymphadenopathy.  I will work on trying to set this up for 06/22/2020.  She will need to be off aspirin for 2 days prior.  We will call her to confirm scheduling. Labs ordered, she will need to get them done in the next week

## 2020-06-16 NOTE — Telephone Encounter (Signed)
I have scheduled the patient for the EBUS 06/22/20 arrive at 5:00am at [facility] No eat or drink after midnight also needs to stop her ASA 2 days prior and needs to be set up for a covid test prior I have left message for pt to call back

## 2020-06-16 NOTE — Telephone Encounter (Signed)
Called spoke with patient.  She did not have any further questions.  Let her know if she did to call the office back we would help  Nothing further needed

## 2020-06-16 NOTE — Telephone Encounter (Signed)
LMTCB for the pt 

## 2020-06-16 NOTE — Telephone Encounter (Signed)
I went ahead and scheduled the required covid test for 06/19/20 at 11:10- can change time if needed. Will await her call back and forward to procedure pool.

## 2020-06-16 NOTE — Telephone Encounter (Signed)
BMET, CBC with Diff and PT/INR orders placed per Dr. Delton Coombes for bronchoscopy.

## 2020-06-19 ENCOUNTER — Other Ambulatory Visit (INDEPENDENT_AMBULATORY_CARE_PROVIDER_SITE_OTHER): Payer: Federal, State, Local not specified - PPO

## 2020-06-19 ENCOUNTER — Other Ambulatory Visit (HOSPITAL_COMMUNITY)
Admission: RE | Admit: 2020-06-19 | Discharge: 2020-06-19 | Disposition: A | Discharge status: Home / Self Care | Payer: Federal, State, Local not specified - PPO | Source: Ambulatory Visit | Attending: Emergency Medicine | Admitting: Emergency Medicine

## 2020-06-19 ENCOUNTER — Encounter (HOSPITAL_COMMUNITY): Payer: Self-pay | Admitting: Emergency Medicine

## 2020-06-19 ENCOUNTER — Other Ambulatory Visit: Payer: Self-pay

## 2020-06-19 DIAGNOSIS — R911 Solitary pulmonary nodule: Secondary | ICD-10-CM

## 2020-06-19 DIAGNOSIS — Z01812 Encounter for preprocedural laboratory examination: Secondary | ICD-10-CM | POA: Insufficient documentation

## 2020-06-19 DIAGNOSIS — Z801 Family history of malignant neoplasm of trachea, bronchus and lung: Secondary | ICD-10-CM | POA: Diagnosis not present

## 2020-06-19 DIAGNOSIS — E7800 Pure hypercholesterolemia, unspecified: Secondary | ICD-10-CM | POA: Diagnosis not present

## 2020-06-19 DIAGNOSIS — E119 Type 2 diabetes mellitus without complications: Secondary | ICD-10-CM | POA: Diagnosis not present

## 2020-06-19 DIAGNOSIS — Z7984 Long term (current) use of oral hypoglycemic drugs: Secondary | ICD-10-CM | POA: Diagnosis not present

## 2020-06-19 DIAGNOSIS — Z7982 Long term (current) use of aspirin: Secondary | ICD-10-CM | POA: Diagnosis not present

## 2020-06-19 DIAGNOSIS — D573 Sickle-cell trait: Secondary | ICD-10-CM | POA: Diagnosis not present

## 2020-06-19 DIAGNOSIS — Z882 Allergy status to sulfonamides status: Secondary | ICD-10-CM | POA: Diagnosis not present

## 2020-06-19 DIAGNOSIS — Z880 Allergy status to penicillin: Secondary | ICD-10-CM | POA: Diagnosis not present

## 2020-06-19 DIAGNOSIS — Z20822 Contact with and (suspected) exposure to covid-19: Secondary | ICD-10-CM | POA: Insufficient documentation

## 2020-06-19 DIAGNOSIS — Z79899 Other long term (current) drug therapy: Secondary | ICD-10-CM | POA: Diagnosis not present

## 2020-06-19 DIAGNOSIS — I889 Nonspecific lymphadenitis, unspecified: Secondary | ICD-10-CM | POA: Diagnosis not present

## 2020-06-19 DIAGNOSIS — Z881 Allergy status to other antibiotic agents status: Secondary | ICD-10-CM | POA: Diagnosis not present

## 2020-06-19 DIAGNOSIS — R590 Localized enlarged lymph nodes: Secondary | ICD-10-CM | POA: Diagnosis present

## 2020-06-19 LAB — CBC WITH DIFFERENTIAL/PLATELET
Basophils Absolute: 0 10*3/uL (ref 0.0–0.1)
Basophils Relative: 0.5 % (ref 0.0–3.0)
Eosinophils Absolute: 0 10*3/uL (ref 0.0–0.7)
Eosinophils Relative: 0.8 % (ref 0.0–5.0)
HCT: 39.2 % (ref 36.0–46.0)
Hemoglobin: 13 g/dL (ref 12.0–15.0)
Lymphocytes Relative: 13.8 % (ref 12.0–46.0)
Lymphs Abs: 0.8 10*3/uL (ref 0.7–4.0)
MCHC: 33.2 g/dL (ref 30.0–36.0)
MCV: 83.6 fl (ref 78.0–100.0)
Monocytes Absolute: 0.4 10*3/uL (ref 0.1–1.0)
Monocytes Relative: 8 % (ref 3.0–12.0)
Neutro Abs: 4.2 10*3/uL (ref 1.4–7.7)
Neutrophils Relative %: 76.9 % (ref 43.0–77.0)
Platelets: 209 10*3/uL (ref 150.0–400.0)
RBC: 4.68 Mil/uL (ref 3.87–5.11)
RDW: 15.4 % (ref 11.5–15.5)
WBC: 5.5 10*3/uL (ref 4.0–10.5)

## 2020-06-19 LAB — BASIC METABOLIC PANEL
BUN: 17 mg/dL (ref 6–23)
CO2: 29 mEq/L (ref 19–32)
Calcium: 10 mg/dL (ref 8.4–10.5)
Chloride: 103 mEq/L (ref 96–112)
Creatinine, Ser: 1.08 mg/dL (ref 0.40–1.20)
GFR: 55.18 mL/min — ABNORMAL LOW (ref >60.00)
Glucose, Bld: 151 mg/dL — ABNORMAL HIGH (ref 70–99)
Potassium: 4.1 mEq/L (ref 3.5–5.1)
Sodium: 139 mEq/L (ref 135–145)

## 2020-06-19 LAB — SARS CORONAVIRUS 2 (TAT 6-24 HRS): SARS Coronavirus 2: NEGATIVE

## 2020-06-19 LAB — PROTIME-INR
INR: 1 ratio (ref 0.8–1.0)
Prothrombin Time: 11.2 s (ref 9.6–13.1)

## 2020-06-19 NOTE — Progress Notes (Addendum)
Ms Carlton denies chest pain or shortness of breath. Patient was tested for Covid and has been in quarantine since that time.  Ms Olesen has type II diabetes, patient checks CBG 2-4 times a day, patient reports that the average for the past week was 107, last A1C was 7.0 on 04/01/20.  I instructed Ms Friscia to not take Metformin on Monday am. I instructed patient to check CBG after awaking and every 2 hours until arrival  to the hospital.  I Instructed patient if CBG is less than 70 to drink1/2 cup of a clear juice. Recheck CBG in 15 minutes if CBG is not over 70 call, pre- op desk at [phone] for further instructions.   I instructed Ms Renner to stop taking Clartin D, it can increase blood pressure, patient stated that her MD said to take Clartin 24 hour , it will not effect blood pressure.24 hour Ms Sonnier stated that she just had labs drawn at the Pulmonary office.  Ms. Osterberg stated that Dr. Neville Route' staff instructed her to hold ASA 81 for 2 days prior to surgery.Patient treports that she takes it prevenity.

## 2020-06-21 NOTE — Anesthesia Preprocedure Evaluation (Addendum)
Anesthesia Evaluation  Patient identified by MRN, date of birth, ID band Patient awake    Reviewed: Allergy & Precautions, NPO status , Patient's Chart, lab work & pertinent test results  Airway Mallampati: I  TM Distance: >3 FB Neck ROM: Full    Dental  (+) Teeth Intact   Pulmonary  Mediastinal adenopathy    Pulmonary exam normal        Cardiovascular hypertension, Pt. on medications  Rhythm:Regular Rate:Normal     Neuro/Psych negative neurological ROS  negative psych ROS   GI/Hepatic negative GI ROS, Neg liver ROS,   Endo/Other  diabetes, Type 2, Oral Hypoglycemic Agents  Renal/GU negative Renal ROS  negative genitourinary   Musculoskeletal  (+) Arthritis , Osteoarthritis,    Abdominal (+)  Abdomen: soft. Bowel sounds: normal.  Peds  Hematology  (+) Blood dyscrasia (sickle cell trait ), anemia ,   Anesthesia Other Findings   Reproductive/Obstetrics                            Anesthesia Physical Anesthesia Plan  ASA: II  Anesthesia Plan: General   Post-op Pain Management:    Induction: Intravenous  PONV Risk Score and Plan: 3 and Ondansetron, Dexamethasone, Midazolam and Treatment may vary due to age or medical condition  Airway Management Planned: Mask and Oral ETT  Additional Equipment: None  Intra-op Plan:   Post-operative Plan: Extubation in OR  Informed Consent: I have reviewed the patients History and Physical, chart, labs and discussed the procedure including the risks, benefits and alternatives for the proposed anesthesia with the patient or authorized representative who has indicated his/her understanding and acceptance.     Dental advisory given  Plan Discussed with: CRNA  Anesthesia Plan Comments: (Lab Results      Component                Value               Date                      WBC                      5.5                 06/19/2020                 HGB                      13.0                06/19/2020                HCT                      39.2                06/19/2020                MCV                      83.6                06/19/2020                PLT  209.0               06/19/2020           Lab Results      Component                Value               Date                      NA                       139                 06/19/2020                K                        4.1                 06/19/2020                CO2                      29                  06/19/2020                GLUCOSE                  151 (H)             06/19/2020                BUN                      17                  06/19/2020                CREATININE               1.08                06/19/2020                CALCIUM                  10.0                06/19/2020                GFRNONAA                 77                  04/01/2020                GFRAA                    89                  04/01/2020          )       Anesthesia Quick Evaluation

## 2020-06-22 ENCOUNTER — Ambulatory Visit (HOSPITAL_COMMUNITY): Payer: Federal, State, Local not specified - PPO | Admitting: Anesthesiology

## 2020-06-22 ENCOUNTER — Other Ambulatory Visit: Payer: Self-pay

## 2020-06-22 ENCOUNTER — Ambulatory Visit (HOSPITAL_COMMUNITY)
Admission: RE | Admit: 2020-06-22 | Discharge: 2020-06-22 | Disposition: A | Discharge status: Home / Self Care | Payer: Federal, State, Local not specified - PPO | Source: Ambulatory Visit | Attending: Emergency Medicine | Admitting: Emergency Medicine

## 2020-06-22 ENCOUNTER — Encounter (HOSPITAL_COMMUNITY): Payer: Self-pay | Admitting: Emergency Medicine

## 2020-06-22 ENCOUNTER — Encounter (HOSPITAL_COMMUNITY)
Admission: RE | Disposition: A | Discharge status: Home / Self Care | Payer: Self-pay | Source: Ambulatory Visit | Attending: Emergency Medicine

## 2020-06-22 DIAGNOSIS — R590 Localized enlarged lymph nodes: Secondary | ICD-10-CM

## 2020-06-22 DIAGNOSIS — Z79899 Other long term (current) drug therapy: Secondary | ICD-10-CM | POA: Insufficient documentation

## 2020-06-22 DIAGNOSIS — J309 Allergic rhinitis, unspecified: Secondary | ICD-10-CM

## 2020-06-22 DIAGNOSIS — D573 Sickle-cell trait: Secondary | ICD-10-CM | POA: Insufficient documentation

## 2020-06-22 DIAGNOSIS — Z882 Allergy status to sulfonamides status: Secondary | ICD-10-CM | POA: Insufficient documentation

## 2020-06-22 DIAGNOSIS — Z881 Allergy status to other antibiotic agents status: Secondary | ICD-10-CM | POA: Insufficient documentation

## 2020-06-22 DIAGNOSIS — Z880 Allergy status to penicillin: Secondary | ICD-10-CM | POA: Insufficient documentation

## 2020-06-22 DIAGNOSIS — Z7982 Long term (current) use of aspirin: Secondary | ICD-10-CM | POA: Insufficient documentation

## 2020-06-22 DIAGNOSIS — Z801 Family history of malignant neoplasm of trachea, bronchus and lung: Secondary | ICD-10-CM | POA: Insufficient documentation

## 2020-06-22 DIAGNOSIS — Z7984 Long term (current) use of oral hypoglycemic drugs: Secondary | ICD-10-CM | POA: Insufficient documentation

## 2020-06-22 DIAGNOSIS — I889 Nonspecific lymphadenitis, unspecified: Secondary | ICD-10-CM | POA: Insufficient documentation

## 2020-06-22 DIAGNOSIS — E7800 Pure hypercholesterolemia, unspecified: Secondary | ICD-10-CM | POA: Insufficient documentation

## 2020-06-22 DIAGNOSIS — E119 Type 2 diabetes mellitus without complications: Secondary | ICD-10-CM

## 2020-06-22 DIAGNOSIS — Z20822 Contact with and (suspected) exposure to covid-19: Secondary | ICD-10-CM | POA: Insufficient documentation

## 2020-06-22 HISTORY — PX: BRONCHIAL BRUSHINGS: SHX5108

## 2020-06-22 HISTORY — DX: Mononeuropathy, unspecified: G58.9

## 2020-06-22 HISTORY — PX: BRONCHIAL WASHINGS: SHX5105

## 2020-06-22 HISTORY — PX: VIDEO BRONCHOSCOPY WITH ENDOBRONCHIAL ULTRASOUND: SHX6177

## 2020-06-22 LAB — BODY FLUID CELL COUNT WITH DIFFERENTIAL
Eos, Fluid: 0 %
Lymphs, Fluid: 33 %
Monocyte-Macrophage-Serous Fluid: 41 % — ABNORMAL LOW (ref 50–90)
Neutrophil Count, Fluid: 26 % — ABNORMAL HIGH (ref 0–25)
Total Nucleated Cell Count, Fluid: 60 cu mm (ref 0–1000)

## 2020-06-22 LAB — GLUCOSE, CAPILLARY
Glucose-Capillary: 154 mg/dL — ABNORMAL HIGH (ref 70–99)
Glucose-Capillary: 140 mg/dL — ABNORMAL HIGH (ref 70–99)

## 2020-06-22 LAB — PROTIME-INR
INR: 1 (ref 0.8–1.2)
Prothrombin Time: 13.1 seconds (ref 11.4–15.2)

## 2020-06-22 LAB — APTT: aPTT: 31 seconds (ref 24–36)

## 2020-06-22 SURGERY — BRONCHOSCOPY, WITH EBUS
Anesthesia: General

## 2020-06-22 MED ORDER — PROPOFOL 10 MG/ML IV BOLUS
INTRAVENOUS | Status: DC | PRN
  Administered 2020-06-22: 140 mg via INTRAVENOUS

## 2020-06-22 MED ORDER — CHLORHEXIDINE GLUCONATE 0.12 % MT SOLN: 15.0000 mL | OROMUCOSAL | Status: AC

## 2020-06-22 MED ORDER — ACETAMINOPHEN 10 MG/ML IV SOLN: 1000.0000 mg | Freq: Once | INTRAVENOUS | Status: DC | PRN

## 2020-06-22 MED ORDER — FLUTICASONE PROPIONATE 50 MCG/ACT NA SUSP: 1.0000 | Freq: Every day | NASAL | Status: AC

## 2020-06-22 MED ORDER — PHENYLEPHRINE 40 MCG/ML (10ML) SYRINGE FOR IV PUSH (FOR BLOOD PRESSURE SUPPORT)
PREFILLED_SYRINGE | INTRAVENOUS | Status: DC | PRN
  Administered 2020-06-22 (×2): 80 ug via INTRAVENOUS

## 2020-06-22 MED ORDER — CHLORHEXIDINE GLUCONATE 0.12 % MT SOLN
OROMUCOSAL | Status: AC
  Filled 2020-06-22: qty 15

## 2020-06-22 MED ORDER — ROCURONIUM BROMIDE 10 MG/ML (PF) SYRINGE
PREFILLED_SYRINGE | INTRAVENOUS | Status: DC | PRN
  Administered 2020-06-22: 20 mg via INTRAVENOUS
  Administered 2020-06-22: 50 mg via INTRAVENOUS

## 2020-06-22 MED ORDER — PROMETHAZINE HCL 25 MG/ML IJ SOLN
6.2500 mg | INTRAMUSCULAR | Status: DC | PRN
Start: 2020-06-22 — End: 2020-06-22

## 2020-06-22 MED ORDER — CHLORHEXIDINE GLUCONATE 0.12 % MT SOLN
OROMUCOSAL | Status: AC
  Administered 2020-06-22: 15 mL via OROMUCOSAL
  Filled 2020-06-22: qty 15

## 2020-06-22 MED ORDER — METFORMIN HCL ER 500 MG PO TB24
500.0000 mg | ORAL_TABLET | Freq: Two times a day (BID) | ORAL | Status: AC
Start: 2020-06-22 — End: ?

## 2020-06-22 MED ORDER — EPINEPHRINE 1 MG/10ML IJ SOSY
PREFILLED_SYRINGE | INTRAMUSCULAR | Status: AC
  Filled 2020-06-22: qty 10

## 2020-06-22 MED ORDER — SUGAMMADEX SODIUM 200 MG/2ML IV SOLN
INTRAVENOUS | Status: DC | PRN
  Administered 2020-06-22: 200 mg via INTRAVENOUS

## 2020-06-22 MED ORDER — FENTANYL CITRATE (PF) 250 MCG/5ML IJ SOLN
INTRAMUSCULAR | Status: DC | PRN
  Administered 2020-06-22: 100 ug via INTRAVENOUS

## 2020-06-22 MED ORDER — LIDOCAINE 2% (20 MG/ML) 5 ML SYRINGE
INTRAMUSCULAR | Status: DC | PRN
  Administered 2020-06-22: 40 mg via INTRAVENOUS

## 2020-06-22 MED ORDER — MIDAZOLAM HCL 2 MG/2ML IJ SOLN
INTRAMUSCULAR | Status: DC | PRN
  Administered 2020-06-22: 2 mg via INTRAVENOUS

## 2020-06-22 MED ORDER — LACTATED RINGERS IV SOLN: INTRAVENOUS | Status: DC

## 2020-06-22 MED ORDER — ONDANSETRON HCL 4 MG/2ML IJ SOLN
INTRAMUSCULAR | Status: DC | PRN
  Administered 2020-06-22: 4 mg via INTRAVENOUS

## 2020-06-22 MED ORDER — FENTANYL CITRATE (PF) 100 MCG/2ML IJ SOLN: 25.0000 ug | INTRAMUSCULAR | Status: DC | PRN

## 2020-06-22 MED ORDER — DEXAMETHASONE SODIUM PHOSPHATE 10 MG/ML IJ SOLN
INTRAMUSCULAR | Status: DC | PRN
  Administered 2020-06-22: 10 mg via INTRAVENOUS

## 2020-06-22 NOTE — H&P (Signed)
Kelly Fitzpatrick is an 60 y.o. female.   Chief Complaint: Mediastinal adenopathy HPI: 60 year old never smoker with a history of diabetes, endometriosis, hypercholesterolemia, sickle cell trait.  She was found to have mediastinal and hilar lymphadenopathy of unclear etiology on CT chest 04/08/2020.  There is also a 6 mm calcified granulomatous subpleural nodule in the right upper lobe.  The lymphadenopathy was initially recognized on cardiac CT 07/12/2019, did not change in the interval.  She presents today for further evaluation.  Family history is significant for lung cancer in her father No known history of autoimmune disease, TB exposure ACE level 04/20/2020 was less than 5  Past Medical History:  Diagnosis Date  . Allergy   . Anemia   . Diabetes mellitus    Type II  . Endometriosis   . Hypercholesteremia   . Pinched nerve in neck   . Sickle cell trait [facility])     Past Surgical History:  Procedure Laterality Date  . ABDOMINAL HYSTERECTOMY    . COLON SURGERY    . COLONOSCOPY    . COLOSTOMY    . COLOSTOMY CLOSURE      Family History  Problem Relation Age of Onset  . Hypertension Mother   . Cancer Mother        breast cancer  . Breast cancer Mother 86  . Cancer Father        lung cancer  . Multiple sclerosis Brother   . Heart disease Brother        heart transplant  . Breast cancer Maternal Aunt    Social History:  reports that she has never smoked. She has never used smokeless tobacco. She reports that she does not drink alcohol and does not use drugs.  Allergies:  Allergies  Allergen Reactions  . Amoxicillin Rash  . Cephalexin Rash  . Hyoscyamine Sulfate Rash    Patient not sure about this.  . Sulfa Antibiotics Rash    Medications Prior to Admission  Medication Sig Dispense Refill  . aspirin 81 MG chewable tablet Chew 81 mg by mouth daily.    . Blood Pressure Monitor DEVI 1 Units by Does not apply route daily as needed. 1 Device 0  . Calcium Carbonate (CALCIUM  600 PO) Take by mouth.    . cholecalciferol (VITAMIN D) 400 UNITS TABS Take 400 Units by mouth daily.    . fluticasone (FLONASE) 50 MCG/ACT nasal spray SPRAY 2 SPRAYS INTO EACH NOSTRIL EVERY DAY (Patient taking differently: Place 1 spray into both nostrils daily.) 48 mL 2  . Garlic 188 MG CAPS Take 500 mg by mouth daily. Garlique    . glucose blood (FREESTYLE PRECISION NEO TEST) test strip Use as instructed 50 each 2  . Lancets MISC To check blood sugar daily dx code E11.65 100 each 0  . lisinopril (ZESTRIL) 5 MG tablet TAKE 1 TABLET BY MOUTH EVERY DAY (Patient taking differently: Take 5 mg by mouth daily.) 90 tablet 0  . loratadine (CLARITIN) 10 MG tablet Take 10 mg by mouth daily. 24 hour    . metFORMIN (GLUCOPHAGE-XR) 500 MG 24 hr tablet Take 2 tablets (1,000 mg total) by mouth daily with breakfast. (Patient taking differently: Take 500 mg by mouth 2 (two) times daily.) 90 tablet 3  . naproxen sodium (ALEVE) 220 MG tablet Take 220 mg by mouth at bedtime as needed (pain).    . NON FORMULARY [place] given by [employer] finger stick reader    . ONE TOUCH ULTRA TEST test  strip 1 EACH BY OTHER ROUTE DAILY. DX E11.65 100 each 0  . pravastatin (PRAVACHOL) 40 MG tablet TAKE 1 TABLET BY MOUTH EVERY DAY (Patient taking differently: Take 40 mg by mouth daily.) 90 tablet 2    Results for orders placed or performed during the hospital encounter of 06/22/20 (from the past 48 hour(s))  Protime-INR     Status: None   Collection Time: 06/22/20  5:49 AM  Result Value Ref Range   Prothrombin Time 13.1 11.4 - 15.2 seconds   INR 1.0 0.8 - 1.2    Comment: (NOTE) INR goal varies based on device and disease states. Performed at [facility], [place] [address], [place], [place] [address]   APTT     Status: None   Collection Time: 06/22/20  5:49 AM  Result Value Ref Range   aPTT 31 24 - 36 seconds    Comment: Performed at [facility] [address], [place], [place] [address]  Glucose, capillary      Status: Abnormal   Collection Time: 06/22/20  6:31 AM  Result Value Ref Range   Glucose-Capillary 154 (H) 70 - 99 mg/dL    Comment: Glucose reference range applies only to samples taken after fasting for at least 8 hours.   No results found.  Review of Systems  Constitutional: Negative for diaphoresis.  Respiratory: Negative for cough, chest tightness, shortness of breath and wheezing.   Cardiovascular: Negative for chest pain.    Blood pressure 136/70, pulse 79, temperature 98.2 F (36.8 C), temperature source Oral, resp. rate 18, height _0  (1.575 m), weight 66.7 kg, SpO2 98 %. Physical Exam  \ Gen: Pleasant, well-nourished, in no distress,  normal affect  ENT: No lesions,  mouth clear,  oropharynx clear, no postnasal drip  Neck: No JVD, no stridor  Lungs: No use of accessory muscles, no crackles or wheezing on normal respiration, no wheeze on forced expiration  Cardiovascular: RRR, heart sounds normal, no murmur or gallops, no peripheral edema  Abdomen: soft and NT, no HSM,  BS normal  Musculoskeletal: No deformities, no cyanosis or clubbing  Neuro: alert, awake, non focal  Skin: Warm, no lesions or rashes     Assessment/Plan Mediastinal lymphadenopathy, unclear etiology. Plan for bronchoscopy with endobronchial ultrasound and nodal biopsies, BAL for culture data.  She will need flow cytometry if we can obtain enough tissue. Procedure discussed in full, patient understands risk, benefits and elected to proceed.   Collene Gobble, MD 06/22/2020, 7:20 AM

## 2020-06-22 NOTE — Discharge Instructions (Signed)
Flexible Bronchoscopy, Care After This sheet gives you information about how to care for yourself after your test. Your doctor may also give you more specific instructions. If you have problems or questions, contact your doctor. Follow these instructions at home: Eating and drinking  Do not eat or drink anything (not even water) for 2 hours after your test, or until your numbing medicine (local anesthetic) wears off.  When your numbness is gone and your cough and gag reflexes have come back, you may: ? Eat only soft foods. ? Slowly drink liquids.  The day after the test, go back to your normal diet. Driving  Do not drive for 24 hours if you were given a medicine to help you relax (sedative).  Do not drive or use heavy machinery while taking prescription pain medicine. General instructions   Take over-the-counter and prescription medicines only as told by your doctor.  Return to your normal activities as told. Ask what activities are safe for you.  Do not use any products that have nicotine or tobacco in them. This includes cigarettes and e-cigarettes. If you need help quitting, ask your doctor.  Keep all follow-up visits as told by your doctor. This is important. It is very important if you had a tissue sample (biopsy) taken. Get help right away if:  You have shortness of breath that gets worse.  You get light-headed.  You feel like you are going to pass out (faint).  You have chest pain.  You cough up: ? More than a little blood. ? More blood than before. Summary  Do not eat or drink anything (not even water) for 2 hours after your test, or until your numbing medicine wears off.  Do not use cigarettes. Do not use e-cigarettes.  Get help right away if you have chest pain.  Please call our office for any questions or concerns.  [phone].  This information is not intended to replace advice given to you by your health care provider. Make sure you discuss any  questions you have with your health care provider. Document Released: 12/05/2008 Document Revised: 01/20/2017 Document Reviewed: 02/26/2016 Elsevier Patient Education  2020 [employer].

## 2020-06-22 NOTE — Anesthesia Procedure Notes (Signed)
Procedure Name: Intubation Date/Time: 06/22/2020 7:44 AM Performed by: Darletta Moll, CRNA Pre-anesthesia Checklist: Patient identified, Emergency Drugs available, Suction available and Patient being monitored Patient Re-evaluated:Patient Re-evaluated prior to induction Oxygen Delivery Method: Circle system utilized Preoxygenation: Pre-oxygenation with 100% oxygen Induction Type: IV induction Ventilation: Mask ventilation without difficulty Laryngoscope Size: Mac and 3 Grade View: Grade I Tube type: Oral Tube size: 8.5 mm Number of attempts: 1 Airway Equipment and Method: Stylet Placement Confirmation: ETT inserted through vocal cords under direct vision,  positive ETCO2,  breath sounds checked- equal and bilateral and CO2 detector Secured at: 21 cm Tube secured with: Tape Dental Injury: Teeth and Oropharynx as per pre-operative assessment

## 2020-06-22 NOTE — Anesthesia Postprocedure Evaluation (Signed)
Anesthesia Post Note  Patient: Kelly Fitzpatrick  Procedure(s) Performed: VIDEO BRONCHOSCOPY WITH ENDOBRONCHIAL ULTRASOUND (N/A ) BRONCHIAL BRUSHINGS BRONCHIAL WASHINGS     Patient location during evaluation: PACU Anesthesia Type: General Level of consciousness: awake and alert Pain management: pain level controlled Vital Signs Assessment: post-procedure vital signs reviewed and stable Respiratory status: spontaneous breathing, nonlabored ventilation, respiratory function stable and patient connected to nasal cannula oxygen Cardiovascular status: blood pressure returned to baseline and stable Postop Assessment: no apparent nausea or vomiting Anesthetic complications: no   No complications documented.  Last Vitals:  Vitals:   06/22/20 0920 06/22/20 0930  BP: 102/67 114/65  Pulse: 72 66  Resp: 11 15  Temp:  36.4 C  SpO2: 99% 99%    Last Pain:  Vitals:   06/22/20 0930  TempSrc:   PainSc: 0-No pain                 Earl Lites P Carsten Carstarphen

## 2020-06-22 NOTE — Transfer of Care (Signed)
Immediate Anesthesia Transfer of Care Note  Patient: Kelly Fitzpatrick  Procedure(s) Performed: VIDEO BRONCHOSCOPY WITH ENDOBRONCHIAL ULTRASOUND (N/A ) BRONCHIAL BRUSHINGS BRONCHIAL WASHINGS  Patient Location: PACU  Anesthesia Type:General  Level of Consciousness: drowsy and patient cooperative  Airway & Oxygen Therapy: Patient Spontanous Breathing  Post-op Assessment: Report given to RN, Post -op Vital signs reviewed and stable and Patient moving all extremities X 4  Post vital signs: Reviewed and stable  Last Vitals:  Vitals Value Taken Time  BP    Temp    Pulse    Resp    SpO2      Last Pain:  Vitals:   06/22/20 0629  TempSrc: Oral  PainSc:       Patients Stated Pain Goal: 3 (06/22/20 8372)  Complications: No complications documented.

## 2020-06-22 NOTE — Op Note (Signed)
Video Bronchoscopy with Endobronchial Ultrasound Procedure Note  Date of Operation: 06/22/2020  Pre-op Diagnosis: Mediastinal lymphadenopathy  Post-op Diagnosis: Same  Surgeon: Baltazar Apo  Assistants: None  Anesthesia: General endotracheal anesthesia  Operation: Flexible video fiberoptic bronchoscopy with endobronchial ultrasound and biopsies.  Estimated Blood Loss: Minimal  Complications: None apparent  Indications and History: Kelly Fitzpatrick is a 60 y.o. female with no tobacco history, found to have mediastinal lymphadenopathy initially on a cardiac screening CT scan, confirmed on subsequent CT scan of the chest.  Recommendation was made to achieve tissue diagnosis via endobronchial ultrasound with nodal biopsies.  The risks, benefits, complications, treatment options and expected outcomes were discussed with the patient.  The possibilities of pneumothorax, pneumonia, reaction to medication, pulmonary aspiration, perforation of a viscus, bleeding, failure to diagnose a condition and creating a complication requiring transfusion or operation were discussed with the patient who freely signed the consent.    Description of Procedure: The patient was examined in the preoperative area and history and data from the preprocedure consultation were reviewed. It was deemed appropriate to proceed.  The patient was taken to [facility] endoscopy room 2, identified as Monia Pouch and the procedure verified as Flexible Video Fiberoptic Bronchoscopy.  A Time Out was held and the above information confirmed. After being taken to the operating room general anesthesia was initiated and the patient  was orally intubated. The video fiberoptic bronchoscope was introduced via the endotracheal tube and a general inspection was performed which showed normal airways throughout with the exception of some slightly hypopigmented mucosa in the medial aspect of the left upper lobe orifice.  Endobronchial brushings  were obtained at this location.  A standard BAL with 60 cc of normal saline instilled and 24 cc returned was performed in the anterior segment of the right middle lobe.  This will be sent for microbiology.  The standard scope was then withdrawn and the endobronchial ultrasound was used to identify and characterize the peritracheal, hilar and bronchial lymph nodes. Inspection showed enlargement at station 7, 4R, 10 R, 11 R. Using real-time ultrasound guidance Wang needle biopsies were take from Station 7, 4R nodes and were sent for cytology and flow cytometry. The patient tolerated the procedure well without apparent complications. There was no significant blood loss. The bronchoscope was withdrawn. Anesthesia was reversed and the patient was taken to the PACU for recovery.   Samples: 1. Wang needle biopsies from 7 node 2. Wang needle biopsies from 4R node 3.  Endobronchial brushings from left upper lobe bronchus 4.  Bronchoalveolar lavage from the right middle lobe  Plans:  The patient will be discharged from the PACU to home when recovered from anesthesia. We will review the cytology, pathology and microbiology results with the patient when they become available. Outpatient followup will be with Dr. Lamonte Sakai.    Baltazar Apo, MD, PhD 06/22/2020, 8:56 AM [facility] [phone] or if no answer before 7:00PM call [phone] For any issues after 7:00PM please call eLink [phone]

## 2020-06-24 ENCOUNTER — Encounter (HOSPITAL_COMMUNITY): Payer: Self-pay | Admitting: Emergency Medicine

## 2020-06-24 LAB — CULTURE, RESPIRATORY W GRAM STAIN: Culture: NORMAL

## 2020-06-24 LAB — ACID FAST SMEAR (AFB, MYCOBACTERIA): Acid Fast Smear: NEGATIVE

## 2020-06-26 ENCOUNTER — Telehealth: Payer: Self-pay | Admitting: Emergency Medicine

## 2020-06-26 LAB — CYTOLOGY - NON PAP

## 2020-06-26 NOTE — Telephone Encounter (Signed)
Called the patient to review EBUS cytology. No answer so will call her back. No malignant cells seen, 4R with non-caseating granulomas, suspect sarcoid. Smears negative, cx pending

## 2020-06-26 NOTE — Telephone Encounter (Signed)
Spoke with the patient and reviewed her cytology results.  Shows granulomas that are suggestive of sarcoidosis.  We will follow her culture data to completion ensure that this is negative.  I have follow-up with her in June.  We will review at that time.

## 2020-07-23 ENCOUNTER — Encounter: Payer: Self-pay | Admitting: Emergency Medicine

## 2020-07-23 ENCOUNTER — Ambulatory Visit: Payer: Federal, State, Local not specified - PPO | Admitting: Emergency Medicine

## 2020-07-23 ENCOUNTER — Other Ambulatory Visit: Payer: Self-pay

## 2020-07-23 DIAGNOSIS — D869 Sarcoidosis, unspecified: Secondary | ICD-10-CM

## 2020-07-23 LAB — FUNGAL ORGANISM REFLEX

## 2020-07-23 LAB — FUNGUS CULTURE WITH STAIN

## 2020-07-23 LAB — FUNGUS CULTURE RESULT

## 2020-07-23 NOTE — Addendum Note (Signed)
Addended by: Dorisann Frames R on: 07/23/2020 11:52 AM   Modules accepted: Orders

## 2020-07-23 NOTE — Patient Instructions (Signed)
We will plan to repeat your CT scan of the chest with contrast in February 2023 to follow sarcoidosis. We will arrange for pulmonary function testing to be done sometime before your next visit. Follow Dr. Delton Coombes in February 2023 to review your studies or sooner if you have any changes in your breathing or other problems. Make sure you get your annual ophthalmology exam and that you mention an evaluation for sarcoid.

## 2020-07-23 NOTE — Assessment & Plan Note (Signed)
Cultures negative and there are granulomas on nodal samples.  Most consistent with sarcoidosis.  Discussed the diagnosis with her today.  No evidence of active disease.  She needs PFT to assess baseline lung function, any obstruction.  We will plan to repeat her CT chest at the 68-month mark to assess for stability.  We will plan to repeat your CT scan of the chest with contrast in February 2023 to follow sarcoidosis. We will arrange for pulmonary function testing to be done sometime before your next visit. Follow Dr. Delton Coombes in February 2023 to review your studies or sooner if you have any changes in your breathing or other problems. Make sure you get your annual ophthalmology exam and that you mention an evaluation for sarcoid.

## 2020-07-23 NOTE — Progress Notes (Signed)
   Subjective:    Patient ID: Kelly Fitzpatrick, female    DOB: [birth year], 60 y.o.   MRN: [id number]  HPI 60 year old never smoker with a history of endometriosis, diabetes, hypercholesterolemia, sickle cell trait.  She is referred today for evaluation of lymphadenopathy on CT scan of the chest.   CT chest 04/08/2020 reviewed by me, shows mediastinal and hilar lymphadenopathy, 7 mm prevascular node, 13 mm right paratracheal node, 10 mm subcarinal node, mild right hilar lymphadenopathy.  There is a 6 mm right upper lobe subpleural calcified granuloma without any other nodules present.  All relatively stable compared with 07/12/2019 cardiac CT.  Denies any cough, wheeze, dyspnea. She is having night sweats for the last week. No fever. No wt loss. She has had some palmar itching without rash over the last months, now resolved.   Father died of lung cancer  ROV 20-Aug-2020 --follow-up visit 60 year old woman, never smoker with history endometriosis, diabetes, hypercholesterolemia, sickle cell trait.  She underwent bronchoscopy on 06/22/2020 to evaluate mediastinal hilar lymphadenopathy.  She also had a 6 mm right upper lobe subpleural calcified granuloma.  Her cytology results showed granulomatous disease consistent with sarcoidosis.  Fungal cultures negative, AFB cultures negative.  ACE level 04/20/2020 negative.    Review of Systems As per HPI     Objective:   Physical Exam Vitals:   Aug 20, 2020 1133  BP: 116/72  Pulse: 75  Temp: 98.1 F (36.7 C)  TempSrc: Temporal  SpO2: 99%  Weight: 145 lb 6.4 oz (66 kg)  Height: 5\' 2"  (1.575 m)   Gen: Pleasant, well-nourished, in no distress,  normal affect  ENT: No lesions,  mouth clear,  oropharynx clear, no postnasal drip  Neck: No JVD, no stridor  Lungs: No use of accessory muscles, no crackles or wheezing on normal respiration, no wheeze on forced expiration  Cardiovascular: RRR, heart sounds normal, no murmur or gallops, no peripheral  edema  Musculoskeletal: No deformities, no cyanosis or clubbing  Neuro: alert, awake, non focal  Skin: Warm, no lesions or rash     Assessment & Plan:  Sarcoidosis Cultures negative and there are granulomas on nodal samples.  Most consistent with sarcoidosis.  Discussed the diagnosis with her today.  No evidence of active disease.  She needs PFT to assess baseline lung function, any obstruction.  We will plan to repeat her CT chest at the 65-month mark to assess for stability.  We will plan to repeat your CT scan of the chest with contrast in February 2023 to follow sarcoidosis. We will arrange for pulmonary function testing to be done sometime before your next visit. Follow Dr. March 2023 in February 2023 to review your studies or sooner if you have any changes in your breathing or other problems. Make sure you get your annual ophthalmology exam and that you mention an evaluation for sarcoid.    March 2023, MD, PhD 08/20/2020, 11:46 AM [facility] [phone] or if no answer before 7:00PM call [phone] For any issues after 7:00PM please call eLink [phone]

## 2020-08-06 LAB — ACID FAST CULTURE WITH REFLEXED SENSITIVITIES (MYCOBACTERIA): Acid Fast Culture: NEGATIVE

## 2020-10-21 ENCOUNTER — Other Ambulatory Visit: Payer: Self-pay | Admitting: Adult Medicine

## 2020-10-21 DIAGNOSIS — Z1231 Encounter for screening mammogram for malignant neoplasm of breast: Secondary | ICD-10-CM

## 2020-10-21 DIAGNOSIS — M8580 Other specified disorders of bone density and structure, unspecified site: Secondary | ICD-10-CM

## 2020-10-29 ENCOUNTER — Other Ambulatory Visit: Payer: Self-pay

## 2020-10-29 ENCOUNTER — Ambulatory Visit
Admission: RE | Admit: 2020-10-29 | Discharge: 2020-10-29 | Disposition: A | Discharge status: Home / Self Care | Payer: Federal, State, Local not specified - PPO | Source: Ambulatory Visit | Attending: Adult Medicine | Admitting: Adult Medicine

## 2020-10-29 DIAGNOSIS — Z1231 Encounter for screening mammogram for malignant neoplasm of breast: Secondary | ICD-10-CM

## 2021-02-25 ENCOUNTER — Telehealth: Payer: Self-pay | Admitting: Emergency Medicine

## 2021-02-25 DIAGNOSIS — R911 Solitary pulmonary nodule: Secondary | ICD-10-CM

## 2021-02-25 NOTE — Telephone Encounter (Signed)
RB, are you ok with [place] ordering a BMET before her CT scan?

## 2021-02-26 NOTE — Telephone Encounter (Signed)
Attempted to call pt but unable to reach. Left message for her to return call. 

## 2021-02-26 NOTE — Telephone Encounter (Signed)
yes

## 2021-03-01 NOTE — Telephone Encounter (Signed)
Spoke with the pt and notified needs labs done prior to CT. She verbalized understanding. Lab order placed. Nothing further needed.

## 2021-03-08 ENCOUNTER — Telehealth: Payer: Self-pay | Admitting: Emergency Medicine

## 2021-03-08 NOTE — Telephone Encounter (Signed)
Called and spoke with patient. She stated that her neurologist is wanting to get a MRI of her lumbar spine. It has not been scheduled yet. She is also scheduled to have a CT for RB on 04/02/21.   Since the MRI hasn't been scheduled yet, she will go ahead and proceed with the different scans.   Nothing further needed at time of call.

## 2021-03-08 NOTE — Telephone Encounter (Signed)
Attempted to call pt but unable to reach. Left message for her to return call. 

## 2021-03-09 ENCOUNTER — Other Ambulatory Visit: Payer: Self-pay | Admitting: Specialist

## 2021-03-09 ENCOUNTER — Other Ambulatory Visit (HOSPITAL_COMMUNITY): Payer: Self-pay | Admitting: Specialist

## 2021-03-09 DIAGNOSIS — G519 Disorder of facial nerve, unspecified: Secondary | ICD-10-CM

## 2021-03-09 DIAGNOSIS — M5416 Radiculopathy, lumbar region: Secondary | ICD-10-CM

## 2021-03-13 ENCOUNTER — Ambulatory Visit (HOSPITAL_COMMUNITY)
Admission: RE | Admit: 2021-03-13 | Discharge: 2021-03-13 | Disposition: A | Discharge status: Home / Self Care | Payer: Federal, State, Local not specified - PPO | Source: Ambulatory Visit | Attending: Specialist | Admitting: Specialist

## 2021-03-13 DIAGNOSIS — G519 Disorder of facial nerve, unspecified: Secondary | ICD-10-CM | POA: Diagnosis present

## 2021-03-13 DIAGNOSIS — M5416 Radiculopathy, lumbar region: Secondary | ICD-10-CM | POA: Diagnosis present

## 2021-03-29 ENCOUNTER — Other Ambulatory Visit (INDEPENDENT_AMBULATORY_CARE_PROVIDER_SITE_OTHER): Payer: Federal, State, Local not specified - PPO

## 2021-03-29 DIAGNOSIS — R911 Solitary pulmonary nodule: Secondary | ICD-10-CM

## 2021-03-29 LAB — BASIC METABOLIC PANEL
BUN: 12 mg/dL (ref 6–23)
CO2: 28 mEq/L (ref 19–32)
Calcium: 9.8 mg/dL (ref 8.4–10.5)
Chloride: 106 mEq/L (ref 96–112)
Creatinine, Ser: 0.94 mg/dL (ref 0.40–1.20)
GFR: 64.83 mL/min (ref >60.00)
Glucose, Bld: 128 mg/dL — ABNORMAL HIGH (ref 70–99)
Potassium: 4.3 mEq/L (ref 3.5–5.1)
Sodium: 139 mEq/L (ref 135–145)

## 2021-04-02 ENCOUNTER — Ambulatory Visit (INDEPENDENT_AMBULATORY_CARE_PROVIDER_SITE_OTHER)
Admission: RE | Admit: 2021-04-02 | Discharge: 2021-04-02 | Disposition: A | Discharge status: Home / Self Care | Payer: Federal, State, Local not specified - PPO | Source: Ambulatory Visit | Attending: Emergency Medicine | Admitting: Emergency Medicine

## 2021-04-02 ENCOUNTER — Other Ambulatory Visit: Payer: Self-pay

## 2021-04-02 DIAGNOSIS — D869 Sarcoidosis, unspecified: Secondary | ICD-10-CM | POA: Diagnosis not present

## 2021-04-02 MED ORDER — IOHEXOL 300 MG/ML  SOLN
80.0000 mL | Freq: Once | INTRAMUSCULAR | Status: AC | PRN
  Administered 2021-04-02: 80 mL via INTRAVENOUS

## 2021-04-13 ENCOUNTER — Ambulatory Visit: Payer: Federal, State, Local not specified - PPO | Admitting: Emergency Medicine

## 2021-04-13 ENCOUNTER — Encounter: Payer: Self-pay | Admitting: Emergency Medicine

## 2021-04-13 ENCOUNTER — Other Ambulatory Visit: Payer: Self-pay

## 2021-04-13 DIAGNOSIS — D869 Sarcoidosis, unspecified: Secondary | ICD-10-CM | POA: Diagnosis not present

## 2021-04-13 NOTE — Progress Notes (Signed)
Subjective:    Patient ID: Kelly Fitzpatrick, female    DOB: 1960/09/16, 61 y.o.   MRN: [id number]  HPI  ROV 07/23/20 --follow-up visit 61 year old woman, never smoker with history endometriosis, diabetes, hypercholesterolemia, sickle cell trait.  She underwent bronchoscopy on 06/22/2020 to evaluate mediastinal hilar lymphadenopathy.  She also had a 6 mm right upper lobe subpleural calcified granuloma.  Her cytology results showed granulomatous disease consistent with sarcoidosis.  Fungal cultures negative, AFB cultures negative.  ACE level 04/20/2020 negative.   ROV 04/13/21 --61 year old woman, never smoker followed for sarcoidosis diagnosed on bronchoscopy 06/22/2020.  All cultures were negative.  ACE level 04/20/2020 normal.  Pulmonary function testing has not yet been done.  She underwent an MRI brain and spine 03/13/2021 for some facial neuropathy and lumbar radiculopathy.  There were no findings consistent with sarcoidosis seen. PMH: Endometriosis, diabetes, hyperlipidemia, sickle cell trait  CT chest 04/02/2021 reviewed by me, shows persistent multiple prominent borderline enlarged mediastinal and hilar lymph nodes, up to 1.3 cm in the right hilum.  Small calcified granulomas especially in the posterior right upper lobe without any new nodules or other concerning findings.  Unchanged from prior.    Review of Systems As per HPI     Objective:   Physical Exam Vitals:   04/13/21 1448  BP: 130/80  Pulse: 82  Temp: 98.1 F (36.7 C)  TempSrc: Oral  SpO2: 99%  Weight: 138 lb 6.4 oz (62.8 kg)  Height: 5\' 2"  (1.575 m)   Gen: Pleasant, well-nourished, in no distress,  normal affect  ENT: No lesions,  mouth clear,  oropharynx clear, no postnasal drip  Neck: No JVD, no stridor  Lungs: No use of accessory muscles, no crackles or wheezing on normal respiration, no wheeze on forced expiration  Cardiovascular: RRR, heart sounds normal, no murmur or gallops, no peripheral edema  Musculoskeletal:  No deformities, no cyanosis or clubbing  Neuro: alert, awake, non focal  Skin: Warm, no lesions or rash, bilateral light brown nevi on her cheeks, right ear that do not look consistent with sarcoidosis.     Assessment & Plan:  Sarcoidosis New diagnosis of sarcoidosis in the last year by EBUS.  Does not appear to be active at this time.  Her CT scan of the chest showed stable mediastinal adenopathy without any infiltrates.  No rash, no respiratory symptoms.  We will follow.  I did give her advice about letting her ophthalmologist know, letting her neurologist know as she is being evaluated for facial twitch and facial neuropathy.  She needs baseline PFT.  I will talk to her about the timing of any repeat chest imaging at her next visit.  We reviewed your CT scan of the chest today.  This is stable compared with priors.  Good news. Your testing shows evidence for sarcoidosis.  I believe that it is quiet at this time.  You should let us know if you develop changes in your breathing, new rash, swollen lymph nodes, all of which can be consistent with flaring sarcoidosis. Please follow-up with ophthalmology annually and let your ophthalmologist know about this diagnosis. Please let your neurologist know about your diagnosis of sarcoidosis as well. We will perform baseline pulmonary function testing Follow with Dr. Lamonte Sakai in 12 months or sooner if you have any problems.    Baltazar Apo, MD, PhD 04/13/2021, 3:05 PM [facility] [phone] or if no answer before 7:00PM call [phone] For any issues after 7:00PM please call eLink  [phone] ° °

## 2021-04-13 NOTE — Patient Instructions (Signed)
We reviewed your CT scan of the chest today.  This is stable compared with priors.  Good news. Your testing shows evidence for sarcoidosis.  I believe that it is quiet at this time.  You should let us know if you develop changes in your breathing, new rash, swollen lymph nodes, all of which can be consistent with flaring sarcoidosis. Please follow-up with ophthalmology annually and let your ophthalmologist know about this diagnosis. Please let your neurologist know about your diagnosis of sarcoidosis as well. We will perform baseline pulmonary function testing Follow with Dr. Delton Coombes in 12 months or sooner if you have any problems.

## 2021-04-13 NOTE — Assessment & Plan Note (Signed)
New diagnosis of sarcoidosis in the last year by EBUS.  Does not appear to be active at this time.  Her CT scan of the chest showed stable mediastinal adenopathy without any infiltrates.  No rash, no respiratory symptoms.  We will follow.  I did give her advice about letting her ophthalmologist know, letting her neurologist know as she is being evaluated for facial twitch and facial neuropathy.  She needs baseline PFT.  I will talk to her about the timing of any repeat chest imaging at her next visit.  We reviewed your CT scan of the chest today.  This is stable compared with priors.  Good news. Your testing shows evidence for sarcoidosis.  I believe that it is quiet at this time.  You should let us know if you develop changes in your breathing, new rash, swollen lymph nodes, all of which can be consistent with flaring sarcoidosis. Please follow-up with ophthalmology annually and let your ophthalmologist know about this diagnosis. Please let your neurologist know about your diagnosis of sarcoidosis as well. We will perform baseline pulmonary function testing Follow with Dr. Lamonte Sakai in 12 months or sooner if you have any problems.

## 2021-04-14 ENCOUNTER — Ambulatory Visit
Admission: RE | Admit: 2021-04-14 | Discharge: 2021-04-14 | Disposition: A | Discharge status: Home / Self Care | Payer: Federal, State, Local not specified - PPO | Source: Ambulatory Visit | Attending: Adult Medicine | Admitting: Adult Medicine

## 2021-04-14 DIAGNOSIS — M8580 Other specified disorders of bone density and structure, unspecified site: Secondary | ICD-10-CM

## 2021-05-07 ENCOUNTER — Ambulatory Visit (INDEPENDENT_AMBULATORY_CARE_PROVIDER_SITE_OTHER): Payer: Federal, State, Local not specified - PPO | Admitting: Emergency Medicine

## 2021-05-07 ENCOUNTER — Other Ambulatory Visit: Payer: Self-pay

## 2021-05-07 DIAGNOSIS — R911 Solitary pulmonary nodule: Secondary | ICD-10-CM

## 2021-05-07 DIAGNOSIS — D869 Sarcoidosis, unspecified: Secondary | ICD-10-CM

## 2021-05-07 LAB — PULMONARY FUNCTION TEST
DL/VA % pred: 136 %
DL/VA: 5.82 ml/min/mmHg/L
DLCO cor % pred: 121 %
DLCO cor: 22.61 ml/min/mmHg
DLCO unc % pred: 121 %
DLCO unc: 22.61 ml/min/mmHg
FEF 25-75 Post: 2.94 L/sec
FEF 25-75 Pre: 2.93 L/sec
FEF2575-%Change-Post: 0 %
FEF2575-%Pred-Post: 161 %
FEF2575-%Pred-Pre: 160 %
FEV1-%Change-Post: 1 %
FEV1-%Pred-Post: 139 %
FEV1-%Pred-Pre: 137 %
FEV1-Post: 2.56 L
FEV1-Pre: 2.53 L
FEV1FVC-%Change-Post: 3 %
FEV1FVC-%Pred-Pre: 104 %
FEV6-%Change-Post: 0 %
FEV6-%Pred-Post: 132 %
FEV6-%Pred-Pre: 133 %
FEV6-Post: 3 L
FEV6-Pre: 3.03 L
FEV6FVC-%Change-Post: 1 %
FEV6FVC-%Pred-Post: 103 %
FEV6FVC-%Pred-Pre: 102 %
FVC-%Change-Post: -2 %
FVC-%Pred-Post: 127 %
FVC-%Pred-Pre: 130 %
FVC-Post: 3 L
FVC-Pre: 3.07 L
Post FEV1/FVC ratio: 85 %
Post FEV6/FVC ratio: 100 %
Pre FEV1/FVC ratio: 83 %
Pre FEV6/FVC Ratio: 99 %
RV % pred: 67 %
RV: 1.32 L
TLC % pred: 99 %
TLC: 4.75 L

## 2021-05-07 NOTE — Progress Notes (Signed)
Full PFT completed today ? ?

## 2021-05-31 ENCOUNTER — Ambulatory Visit (HOSPITAL_COMMUNITY)
Admission: EM | Admit: 2021-05-31 | Discharge: 2021-05-31 | Disposition: A | Discharge status: Home / Self Care | Payer: Federal, State, Local not specified - PPO

## 2021-05-31 ENCOUNTER — Encounter (HOSPITAL_COMMUNITY): Payer: Self-pay

## 2021-05-31 DIAGNOSIS — T148XXA Other injury of unspecified body region, initial encounter: Secondary | ICD-10-CM

## 2021-05-31 NOTE — ED Provider Notes (Signed)
?[facility] ? ? ? ?CSN: [id number] ?Arrival date & time: 05/31/21  1210 ? ? ?  ? ?History   ?Chief Complaint ?Chief Complaint  ?Patient presents with  ? Abrasion  ? ? ?HPI ?Kelly Fitzpatrick is a 61 y.o. female.  ? ?Pt reports she was walking in her neighborhood about one week ago when a neighbors dog jumped up on her.  She is unsure if she was bitten.  Unsure of dogs vaccination status.  She has a small abrasion on her forearm which is healing well. No other injuries or skin tears.  ? ? ?Past Medical History:  ?Diagnosis Date  ? Allergy   ? Anemia   ? Diabetes mellitus   ? Type II  ? Endometriosis   ? Hypercholesteremia   ? Pinched nerve in neck   ? Sickle cell trait (HCC)   ? ? ?Patient Active Problem List  ? Diagnosis Date Noted  ? Sarcoidosis 04/23/2020  ? Chest pain 06/11/2019  ? Cervical spondylosis with myelopathy and radiculopathy 06/11/2019  ? Encounter for screening mammogram for malignant neoplasm of breast 06/11/2019  ? Nasal congestion 06/11/2019  ? Screening for colon cancer 06/11/2019  ? Annual physical exam 06/11/2019  ? Controlled type 2 diabetes mellitus with neuropathy (HCC) 06/11/2019  ? Hyperlipidemia 06/20/2012  ? Diabetes (HCC) 06/20/2012  ? ? ?Past Surgical History:  ?Procedure Laterality Date  ? ABDOMINAL HYSTERECTOMY    ? BRONCHIAL BRUSHINGS  06/22/2020  ? Procedure: BRONCHIAL BRUSHINGS;  Surgeon: Leslye Peer, MD;  Location: [facility] ENDOSCOPY;  Service: Cardiopulmonary;;  ? BRONCHIAL WASHINGS  06/22/2020  ? Procedure: BRONCHIAL WASHINGS;  Surgeon: Leslye Peer, MD;  Location: [facility] ENDOSCOPY;  Service: Cardiopulmonary;;  ? COLON SURGERY    ? COLONOSCOPY    ? COLOSTOMY    ? COLOSTOMY CLOSURE    ? VIDEO BRONCHOSCOPY WITH ENDOBRONCHIAL ULTRASOUND N/A 06/22/2020  ? Procedure: VIDEO BRONCHOSCOPY WITH ENDOBRONCHIAL ULTRASOUND;  Surgeon: Leslye Peer, MD;  Location: [facility] ENDOSCOPY;  Service: Cardiopulmonary;  Laterality: N/A;  ? ? ?OB History   ?No obstetric history on file. ?  ? ? ? ?Home  Medications   ? ?Prior to Admission medications   ?Medication Sig Start Date End Date Taking? Authorizing Provider  ?aspirin 81 MG chewable tablet Chew 81 mg by mouth daily.    [provider]  ?Blood Pressure Monitor DEVI 1 Units by Does not apply route daily as needed. 11/02/17   Magdalene River, PA-C  ?Calcium Carbonate (CALCIUM 600 PO) Take by mouth.    [provider]  ?cholecalciferol (VITAMIN D) 400 UNITS TABS Take 400 Units by mouth daily.    [provider]  ?fluticasone (FLONASE) 50 MCG/ACT nasal spray Place 1 spray into both nostrils daily. 06/22/20   Leslye Peer, MD  ?Garlic 500 MG CAPS Take 500 mg by mouth daily. Garlique    [provider]  ?glucose blood (FREESTYLE PRECISION NEO TEST) test strip Use as instructed 11/02/17   Magdalene River, PA-C  ?Lancets MISC To check blood sugar daily dx code E11.65 05/15/17   Lezlie Lye, Meda Coffee, MD  ?lisinopril (ZESTRIL) 5 MG tablet TAKE 1 TABLET BY MOUTH EVERY DAY ?Patient not taking: Reported on 04/13/2021 08/07/19   Lezlie Lye, Meda Coffee, MD  ?loratadine (CLARITIN) 10 MG tablet Take 10 mg by mouth daily. 24 hour    [provider]  ?losartan (COZAAR) 25 MG tablet Take 25 mg by mouth daily. 01/18/21   [provider]  ?metFORMIN (GLUCOPHAGE-XR) 500 MG 24 hr tablet Take 1 tablet (500 mg total) by mouth 2 (two) times daily. 06/22/20   Leslye Peer, MD  ?montelukast (SINGULAIR) 10 MG tablet Take 10 mg by mouth at bedtime. 01/26/21   [provider]  ?naproxen sodium (ALEVE) 220 MG tablet Take 220 mg by mouth at bedtime as needed (pain).    [provider]  ?Beecher Mcardle given by [employer] finger stick reader    [provider]  ?ONE TOUCH ULTRA TEST test strip 1 EACH BY OTHER ROUTE DAILY. DX E11.65 11/07/17   Benjiman Core D, PA-C  ?pravastatin (PRAVACHOL) 40 MG tablet TAKE 1 TABLET BY MOUTH EVERY DAY ?Patient taking differently: Take 40 mg by mouth daily. 08/10/19    Erasmo Downer, MD  ? ? ?Family History ?Family History  ?Problem Relation Age of Onset  ? Hypertension Mother   ? Cancer Mother   ?     breast cancer  ? Breast cancer Mother 38  ? Cancer Father   ?     lung cancer  ? Multiple sclerosis Brother   ? Heart disease Brother   ?     heart transplant  ? Breast cancer Maternal Aunt   ? ? ?Social History ?Social History  ? ?Tobacco Use  ? Smoking status: Never  ? Smokeless tobacco: Never  ?Vaping Use  ? Vaping Use: Never used  ?Substance Use Topics  ? Alcohol use: No  ? Drug use: No  ? ? ? ?Allergies   ?Amoxicillin, Cephalexin, Hyoscyamine sulfate, and Sulfa antibiotics ? ? ?Review of Systems ?Review of Systems  ?Constitutional:  Negative for chills and fever.  ?HENT:  Negative for ear pain and sore throat.   ?Eyes:  Negative for pain and visual disturbance.  ?Respiratory:  Negative for cough and shortness of breath.   ?Cardiovascular:  Negative for chest pain and palpitations.  ?Gastrointestinal:  Negative for abdominal pain and vomiting.  ?Genitourinary:  Negative for dysuria and hematuria.  ?Musculoskeletal:  Negative for arthralgias and back pain.  ?Skin:  Positive for wound. Negative for color change and rash.  ?Neurological:  Negative for seizures and syncope.  ?All other systems reviewed and are negative. ? ? ?Physical Exam ?Triage Vital Signs ?ED Triage Vitals  ?Enc Vitals Group  ?   BP 05/31/21 1306 (!) 152/80  ?   Pulse Rate 05/31/21 1306 76  ?   Resp 05/31/21 1306 18  ?   Temp 05/31/21 1306 98.2 ?F (36.8 ?C)  ?   Temp Source 05/31/21 1306 Oral  ?   SpO2 05/31/21 1306 98 %  ?   Weight --   ?   Height --   ?   Head Circumference --   ?   Peak Flow --   ?   Pain Score 05/31/21 1307 0  ?   Pain Loc --   ?   Pain Edu? --   ?   Excl. in GC? --   ? ?No data found. ? ?Updated Vital Signs ?BP (!) 152/80 (BP Location: Right Arm)   Pulse 76   Temp 98.2 ?F (36.8 ?C) (Oral)   Resp 18   SpO2 98%  ? ?Visual Acuity ?Right Eye Distance:   ?Left Eye Distance:    ?Bilateral Distance:   ? ?Right Eye Near:   ?Left Eye Near:    ?Bilateral Near:    ? ?Physical Exam ?Vitals and nursing note reviewed.  ?Constitutional:   ?  General: She is not in acute distress. ?   Appearance: She is well-developed.  ?HENT:  ?   Head: Normocephalic and atraumatic.  ?Eyes:  ?   Conjunctiva/sclera: Conjunctivae normal.  ?Cardiovascular:  ?   Rate and Rhythm: Normal rate and regular rhythm.  ?   Heart sounds: No murmur heard. ?Pulmonary:  ?   Effort: Pulmonary effort is normal. No respiratory distress.  ?   Breath sounds: Normal breath sounds.  ?Abdominal:  ?   Palpations: Abdomen is soft.  ?   Tenderness: There is no abdominal tenderness.  ?Musculoskeletal:     ?   General: No swelling.  ?   Cervical back: Neck supple.  ?   Comments: Two small pinpoint wounds which are scabbed over healing well with no redness, erythema, or swelling.   ?Skin: ?   General: Skin is warm and dry.  ?   Capillary Refill: Capillary refill takes less than 2 seconds.  ?Neurological:  ?   Mental Status: She is alert.  ?Psychiatric:     ?   Mood and Affect: Mood normal.  ? ? ? ?UC Treatments / Results  ?Labs ?(all labs ordered are listed, but only abnormal results are displayed) ?Labs Reviewed - No data to display ? ?EKG ? ? ?Radiology ?No results found. ? ?Procedures ?Procedures (including critical care time) ? ?Medications Ordered in UC ?Medications - No data to display ? ?Initial Impression / Assessment and Plan / UC Course  ?I have reviewed the triage vital signs and the nursing notes. ? ?Pertinent labs & imaging results that were available during my care of the patient were reviewed by me and considered in my medical decision making (see chart for details). ? ?  ? ?Small abrasion healing well, no signs of infection.  No antibiotics indicated at this time.  One week out, unsure if a bite wound.  Reassurance given. Return precautions discussed.  ?Final Clinical Impressions(s) / UC Diagnoses  ? ?Final diagnoses:   ?Abrasion  ? ? ? ?Discharge Instructions   ? ?  ?Abrasion is healing well with no signs of infection ?If anything changes can return for recheck.  ? ? ? ? ?ED Prescriptions   ?None ?  ? ?PDMP not reviewed this encounter. ?  ?Ward,

## 2021-05-31 NOTE — ED Triage Notes (Signed)
Pt presents with small abrasion on right arm from neighbors dog X 1 week ago; pt is unsure of dogs vaccination status but wound is cleaned and well healed. ? ?Pt last tetanus verified in 2018. ?

## 2021-05-31 NOTE — Discharge Instructions (Signed)
Abrasion is healing well with no signs of infection ?If anything changes can return for recheck.  ?

## 2021-08-23 ENCOUNTER — Other Ambulatory Visit: Payer: Self-pay

## 2021-08-23 ENCOUNTER — Emergency Department (HOSPITAL_COMMUNITY)
Admission: EM | Admit: 2021-08-23 | Discharge: 2021-08-24 | Disposition: A | Discharge status: Home / Self Care | Payer: Federal, State, Local not specified - PPO | Attending: Emergency Medicine | Admitting: Emergency Medicine

## 2021-08-23 DIAGNOSIS — E119 Type 2 diabetes mellitus without complications: Secondary | ICD-10-CM | POA: Insufficient documentation

## 2021-08-23 DIAGNOSIS — Z23 Encounter for immunization: Secondary | ICD-10-CM | POA: Diagnosis not present

## 2021-08-23 DIAGNOSIS — Z7982 Long term (current) use of aspirin: Secondary | ICD-10-CM | POA: Insufficient documentation

## 2021-08-23 DIAGNOSIS — S80212A Abrasion, left knee, initial encounter: Secondary | ICD-10-CM | POA: Diagnosis not present

## 2021-08-23 DIAGNOSIS — W19XXXA Unspecified fall, initial encounter: Secondary | ICD-10-CM | POA: Insufficient documentation

## 2021-08-23 DIAGNOSIS — S80219A Abrasion, unspecified knee, initial encounter: Secondary | ICD-10-CM

## 2021-08-23 DIAGNOSIS — Z7984 Long term (current) use of oral hypoglycemic drugs: Secondary | ICD-10-CM | POA: Insufficient documentation

## 2021-08-23 DIAGNOSIS — M25561 Pain in right knee: Secondary | ICD-10-CM

## 2021-08-23 DIAGNOSIS — S80211A Abrasion, right knee, initial encounter: Secondary | ICD-10-CM | POA: Insufficient documentation

## 2021-08-23 DIAGNOSIS — S80911A Unspecified superficial injury of right knee, initial encounter: Secondary | ICD-10-CM | POA: Diagnosis present

## 2021-08-23 NOTE — ED Provider Triage Note (Signed)
  Emergency Medicine Provider Triage Evaluation Note  MRN:  [id number]  Arrival date & time: 08/23/21    Medically screening exam initiated at 11:46 PM.   CC:   Fall  HPI:  Kelly Fitzpatrick is a 61 y.o. year-old female presents to the ED with chief complaint of fall.  Hit bilateral knees.  She is uncertain what caused her to fall.  Didn't feel dizzy, didn't trip, states she doesn't know what happened.  Has had a couple similar episodes over the past couple of months.  History provided by History provided by patient. ROS:  -As included in HPI PE:   Vitals:   08/23/21 2336  BP: (!) 157/72  Pulse: 96  Resp: 15  Temp: 99 F (37.2 C)  SpO2: 98%    Non-toxic appearing No respiratory distress CN 3-12 intact Abrasions to bilateral knees MDM:   I've ordered labs and imaging in triage to expedite lab/diagnostic workup.  Patient was informed that the remainder of the evaluation will be completed by another provider, this initial triage assessment does not replace that evaluation, and the importance of remaining in the ED until their evaluation is complete.    Roxy Horseman, PA-C 08/23/21 2348

## 2021-08-24 ENCOUNTER — Encounter (HOSPITAL_COMMUNITY): Payer: Self-pay | Admitting: Emergency Medicine

## 2021-08-24 ENCOUNTER — Other Ambulatory Visit: Payer: Self-pay

## 2021-08-24 ENCOUNTER — Emergency Department (HOSPITAL_COMMUNITY): Payer: Federal, State, Local not specified - PPO

## 2021-08-24 LAB — COMPREHENSIVE METABOLIC PANEL
ALT: 14 U/L (ref 0–44)
AST: 15 U/L (ref 15–41)
Albumin: 3.7 g/dL (ref 3.5–5.0)
Alkaline Phosphatase: 55 U/L (ref 38–126)
Anion gap: 7 (ref 5–15)
BUN: 16 mg/dL (ref 8–23)
CO2: 22 mmol/L (ref 22–32)
Calcium: 9.3 mg/dL (ref 8.9–10.3)
Chloride: 110 mmol/L (ref 98–111)
Creatinine, Ser: 0.89 mg/dL (ref 0.44–1.00)
GFR, Estimated: >60 mL/min (ref >60)
Glucose, Bld: 169 mg/dL — ABNORMAL HIGH (ref 70–99)
Potassium: 3.6 mmol/L (ref 3.5–5.1)
Sodium: 139 mmol/L (ref 135–145)
Total Bilirubin: 0.2 mg/dL — ABNORMAL LOW (ref 0.3–1.2)
Total Protein: 6.3 g/dL — ABNORMAL LOW (ref 6.5–8.1)

## 2021-08-24 LAB — CBC WITH DIFFERENTIAL/PLATELET
Abs Immature Granulocytes: 0.06 10*3/uL (ref 0.00–0.07)
Basophils Absolute: 0.1 10*3/uL (ref 0.0–0.1)
Basophils Relative: 1 %
Eosinophils Absolute: 0 10*3/uL (ref 0.0–0.5)
Eosinophils Relative: 0 %
HCT: 36.5 % (ref 36.0–46.0)
Hemoglobin: 12.2 g/dL (ref 12.0–15.0)
Immature Granulocytes: 1 %
Lymphocytes Relative: 7 %
Lymphs Abs: 0.7 10*3/uL (ref 0.7–4.0)
MCH: 29.8 pg (ref 26.0–34.0)
MCHC: 33.4 g/dL (ref 30.0–36.0)
MCV: 89 fL (ref 80.0–100.0)
Monocytes Absolute: 0.7 10*3/uL (ref 0.1–1.0)
Monocytes Relative: 8 %
Neutro Abs: 7.7 10*3/uL (ref 1.7–7.7)
Neutrophils Relative %: 83 %
Platelets: 216 10*3/uL (ref 150–400)
RBC: 4.1 MIL/uL (ref 3.87–5.11)
RDW: 13.8 % (ref 11.5–15.5)
WBC: 9.2 10*3/uL (ref 4.0–10.5)
nRBC: 0 % (ref 0.0–0.2)

## 2021-08-24 MED ORDER — TETANUS-DIPHTH-ACELL PERTUSSIS 5-2.5-18.5 LF-MCG/0.5 IM SUSY
0.5000 mL | PREFILLED_SYRINGE | Freq: Once | INTRAMUSCULAR | Status: AC
  Administered 2021-08-24: 0.5 mL via INTRAMUSCULAR
  Filled 2021-08-24: qty 0.5

## 2021-08-24 NOTE — ED Triage Notes (Signed)
Pt reported to ED for evaluation of fall this evening. Pt states she did not trip or did not become syncopal or faint but just fell forward. Reports pain to bilateral knees with abrasions noted. Pt ambulatory with strong and steady gait.

## 2021-08-24 NOTE — Discharge Instructions (Signed)
You came to the emergency department to be evaluated after suffering a fall.  The x-ray images obtained of your knees did not show any acute fractures or dislocations.  We did update your tetanus shot due to your knee abrasions.  Your physical exam and lab results were reassuring.  Please follow-up with your primary care doctor for repeat evaluation.  Get help right away if: You have: Loss of feeling (numbness), tingling, or weakness in your arms or legs. Very bad neck pain, especially tenderness in the middle of the back of your neck. A change in your ability to control your pee or poop (stool). More pain in any area of your body. Swelling in any area of your body, especially your legs. Shortness of breath or light-headedness. Chest pain. Blood in your pee, poop, or vomit. Very bad pain in your belly (abdomen) or your back. Very bad headaches or headaches that are getting worse. Sudden vision loss or double vision. The black center of your eye (pupil) is an odd shape or size.

## 2021-08-24 NOTE — ED Provider Notes (Signed)
[facility] EMERGENCY DEPARTMENT Provider Note   CSN: [id number] Arrival date & time: 08/23/21  2328     History  Chief Complaint  Patient presents with   Kelly Fitzpatrick is a 61 y.o. female with a past medical history of diabetes mellitus, anemia, hypercholesterolemia.  Presents to the emergency department with a chief complaint of fall and bilateral knee pain.  Patient reports that yesterday approximately 8 PM she was walking in her neighborhood which she does on a daily basis for exercise.  Patient reports that she turned her head to the side and when she returned her to a forward position felt himself falling forward.  Patient reports landing on her knees and catching herself with bilateral arms.  Patient denies hitting her head or any loss of consciousness.  Patient had no dizziness, lightheadedness, chest pain, shortness of breath, sudden onset of headache preceding her fall.  Patient was able to get herself off the floor and walk home without incident.  Patient reported to previous provider that she had a couple similar episodes of past couple months however when asked by myself she denies this.  Patient reports abrasions to bilateral knees.  Reports pain with touch and movement.  Patient has not tried any medications to alleviate her pain.  Patient reports that she has chronic neck and back pain however states that there is no change after fall.  Patient reports history of neuropathy to bilateral feet.  Denies any new numbness weakness after fall.  Patient reports that she believes her last tetanus shot was 6 years prior.  Patient denies any saddle anesthesia, urinary retention/incontinence, abdominal pain, nausea, vomiting.     Fall Pertinent negatives include no chest pain, no abdominal pain, no headaches and no shortness of breath.       Home Medications Prior to Admission medications   Medication Sig Start Date End Date Taking? Authorizing  Provider  aspirin 81 MG chewable tablet Chew 81 mg by mouth daily.    [provider]  Blood Pressure Monitor DEVI 1 Units by Does not apply route daily as needed. 11/02/17   Benjiman Core D, PA-C  Calcium Carbonate (CALCIUM 600 PO) Take by mouth.    [provider]  cholecalciferol (VITAMIN D) 400 UNITS TABS Take 400 Units by mouth daily.    [provider]  fluticasone (FLONASE) 50 MCG/ACT nasal spray Place 1 spray into both nostrils daily. 06/22/20   Leslye Peer, MD  Garlic 500 MG CAPS Take 500 mg by mouth daily. Garlique    [provider]  glucose blood (FREESTYLE PRECISION NEO TEST) test strip Use as instructed 11/02/17   Benjiman Core D, PA-C  Lancets MISC To check blood sugar daily dx code E11.65 05/15/17   Lezlie Lye, Meda Coffee, MD  lisinopril (ZESTRIL) 5 MG tablet TAKE 1 TABLET BY MOUTH EVERY DAY Patient not taking: Reported on 04/13/2021 08/07/19   Lezlie Lye, Meda Coffee, MD  loratadine (CLARITIN) 10 MG tablet Take 10 mg by mouth daily. 24 hour    [provider]  losartan (COZAAR) 25 MG tablet Take 25 mg by mouth daily. 01/18/21   [provider]  metFORMIN (GLUCOPHAGE-XR) 500 MG 24 hr tablet Take 1 tablet (500 mg total) by mouth 2 (two) times daily. 06/22/20   Leslye Peer, MD  montelukast (SINGULAIR) 10 MG tablet Take 10 mg by mouth at bedtime. 01/26/21   [provider]  naproxen sodium (ALEVE) 220  MG tablet Take 220 mg by mouth at bedtime as needed (pain).    [provider]  NON Bailey Mech given by [employer] finger stick reader    [provider]  ONE TOUCH ULTRA TEST test strip 1 EACH BY OTHER ROUTE DAILY. DX E11.65 11/07/17   Benjiman Core D, PA-C  pravastatin (PRAVACHOL) 40 MG tablet TAKE 1 TABLET BY MOUTH EVERY DAY Patient taking differently: Take 40 mg by mouth daily. 08/10/19   Erasmo Downer, MD      Allergies    Amoxicillin, Cephalexin, Hyoscyamine sulfate, and Sulfa  antibiotics    Review of Systems   Review of Systems  Constitutional:  Negative for chills and fever.  HENT:  Negative for facial swelling.   Eyes:  Negative for visual disturbance.  Respiratory:  Negative for shortness of breath.   Cardiovascular:  Negative for chest pain.  Gastrointestinal:  Negative for abdominal pain, nausea and vomiting.  Genitourinary:  Negative for difficulty urinating and dysuria.  Musculoskeletal:  Positive for arthralgias, back pain and neck pain. Negative for gait problem and myalgias.  Skin:  Positive for wound. Negative for color change and rash.  Neurological:  Positive for numbness. Negative for dizziness, tremors, seizures, syncope, facial asymmetry, speech difficulty, weakness, light-headedness and headaches.  Psychiatric/Behavioral:  Negative for confusion.     Physical Exam Updated Vital Signs BP 125/73 (BP Location: Left Arm)   Pulse 69   Temp 99 F (37.2 C) (Oral)   Resp 20   SpO2 99%  Physical Exam Vitals and nursing note reviewed.  Constitutional:      General: She is not in acute distress.    Appearance: She is not ill-appearing, toxic-appearing or diaphoretic.  HENT:     Head: Normocephalic and atraumatic. No raccoon eyes, Battle's sign, abrasion, contusion, right periorbital erythema, left periorbital erythema or laceration.  Eyes:     General:        Right eye: No discharge.        Left eye: No discharge.     Extraocular Movements: Extraocular movements intact.     Conjunctiva/sclera: Conjunctivae normal.     Pupils: Pupils are equal, round, and reactive to light.  Cardiovascular:     Rate and Rhythm: Normal rate.  Pulmonary:     Effort: Pulmonary effort is normal.  Abdominal:     General: Abdomen is flat. There is no distension. There are no signs of injury.     Tenderness: There is no abdominal tenderness. There is no guarding or rebound.  Musculoskeletal:     Cervical back: Normal range of motion and neck supple. No  swelling, edema, deformity, erythema, signs of trauma, lacerations, rigidity, spasms, torticollis, tenderness, bony tenderness or crepitus. No pain with movement. Normal range of motion.     Thoracic back: No swelling, edema, deformity, signs of trauma, lacerations, spasms, tenderness or bony tenderness.     Lumbar back: No swelling, edema, deformity, signs of trauma, lacerations, spasms, tenderness or bony tenderness.     Right knee: Laceration and bony tenderness present. No swelling, deformity, effusion, erythema, ecchymosis or crepitus. Normal range of motion. No tenderness. No medial joint line or lateral joint line tenderness.     Left knee: Laceration and bony tenderness present. No swelling, deformity, effusion, erythema, ecchymosis or crepitus. Normal range of motion. No tenderness. No medial joint line or lateral joint line tenderness.     Right lower leg: Normal.     Left lower leg: Normal.  Comments: Superficial abrasions to bilateral knees.  Patient has full range of motion to bilateral knees.  Patient does complain of discomfort with knee range of motion.  Minimal tenderness over bilateral patella.  No midline tenderness or deformity to cervical, thoracic, or lumbar spine.  No tenderness, bony tenderness, or deformity to bilateral upper or lower extremities.  Skin:    General: Skin is warm and dry.  Neurological:     General: No focal deficit present.     Mental Status: She is alert and oriented to person, place, and time.     GCS: GCS eye subscore is 4. GCS verbal subscore is 5. GCS motor subscore is 6.     Cranial Nerves: No cranial nerve deficit or facial asymmetry.     Sensory: Sensation is intact.     Motor: No weakness, tremor, seizure activity or pronator drift.     Coordination: Romberg sign negative. Finger-Nose-Finger Test normal.     Gait: Gait is intact. Gait normal.     Comments: CN II-XII intact, equal grip strength, +5 strength to bilateral upper and lower  extremities, sensation to light touch grossly intact to bilateral upper and lower extremities.  Psychiatric:        Behavior: Behavior is cooperative.     ED Results / Procedures / Treatments   Labs (all labs ordered are listed, but only abnormal results are displayed) Labs Reviewed  COMPREHENSIVE METABOLIC PANEL - Abnormal; Notable for the following components:      Result Value   Glucose, Bld 169 (*)    Total Protein 6.3 (*)    Total Bilirubin 0.2 (*)    All other components within normal limits  CBC WITH DIFFERENTIAL/PLATELET  CBG MONITORING, ED    EKG EKG Interpretation  Date/Time:  Monday August 23 2021 23:58:04 EDT Ventricular Rate:  77 PR Interval:  146 QRS Duration: 66 QT Interval:  368 QTC Calculation: 416 R Axis:   -5 Text Interpretation: Normal sinus rhythm Septal infarct , age undetermined Abnormal ECG When compared with ECG of 21-Mar-2020 15:54, No significant change since last tracing Confirmed by Benjiman Core [phone]) on 08/24/2021 8:40:04 AM  Radiology DG Knee Complete 4 Views Left  Result Date: 08/24/2021 CLINICAL DATA:  Recent fall with left knee pain, initial encounter EXAM: LEFT KNEE - COMPLETE 4+ VIEW COMPARISON:  None Available. FINDINGS: No evidence of fracture, dislocation, or joint effusion. No evidence of arthropathy or other focal bone abnormality. Soft tissues are unremarkable. IMPRESSION: No acute abnormality noted. Electronically Signed   By: Alcide Clever M.D.   On: 08/24/2021 00:53   DG Knee Complete 4 Views Right  Result Date: 08/24/2021 CLINICAL DATA:  Recent fall with knee pain, initial encounter EXAM: RIGHT KNEE - COMPLETE 4+ VIEW COMPARISON:  None Available. FINDINGS: No evidence of fracture, dislocation, or joint effusion. No evidence of arthropathy or other focal bone abnormality. Soft tissues are unremarkable. IMPRESSION: No acute abnormality noted. Electronically Signed   By: Alcide Clever M.D.   On: 08/24/2021 00:51   CT HEAD WO CONTRAST  ( )  Result Date: 08/24/2021 CLINICAL DATA:  Recent fall with headaches, initial encounter EXAM: CT HEAD WITHOUT CONTRAST TECHNIQUE: Contiguous axial images were obtained from the base of the skull through the vertex without intravenous contrast. RADIATION DOSE REDUCTION: This exam was performed according to the departmental dose-optimization program which includes automated exposure control, adjustment of the mA and/or kV according to patient size and/or use of iterative reconstruction technique. COMPARISON:  10/19/2015 FINDINGS:  Brain: No evidence of acute infarction, hemorrhage, hydrocephalus, extra-axial collection or mass lesion/mass effect. Vascular: No hyperdense vessel or unexpected calcification. Skull: Normal. Negative for fracture or focal lesion. Sinuses/Orbits: No acute finding. Other: None. IMPRESSION: No acute intracranial abnormality noted. No change from the prior exam. Electronically Signed   By: Alcide Clever M.D.   On: 08/24/2021 00:23    Procedures Procedures    Medications Ordered in ED Medications  Tdap (BOOSTRIX) injection 0.5 mL (has no administration in time range)    ED Course/ Medical Decision Making/ A&P                           Medical Decision Making Risk Prescription drug management.   Alert 61 year old female in no acute distress, nontoxic-appearing.  Presents the emergency department with a chief complaint of bilateral knee pain after suffering a fall.  Information obtained from patient.  I reviewed patient's past medical records including previous prior notes, outpatient provider notes, labs, and imaging.  Patient has medical history as outlined in HPI which complicates her care.  X-rays, CT imaging, and labs were obtained while patient was in triage.  I personally viewed and interpreted patient's EKG.  Tracing shows sinus rhythm with no significant change from previous tracing.  I personally viewed and interpreted patient's lab results.  CMP and CBC  are unremarkable.  I personally viewed and interpreted patient's x-ray imaging.  Agree with radiology interpretation of no acute osseous abnormality to bilateral knees.  I personally viewed and interpreted patient CT imaging.  Agree with radiology interpretation of no acute intracranial hemorrhage.  CT imaging of cervical spine was considered however patient has no midline tenderness or deformity.  Patient has full range of motion to neck without complaints of pain.  As symptoms started after patient turns her head there was concern for possible carotid artery occlusion.  Patient able to have full range of motion to neck with no falls, lightheadedness, or syncope.  Low suspicion for artery occlusion at this time.  Patient had no preceding lightheadedness, chest pain or shortness of breath.  Patient had no syncope.  EKG is not significant change from previous.  Low suspicion for cardiac cause of patient's fall.  I suspect that patient's fall may have been mechanical in nature.  We will have patient follow-up with her primary care doctor for repeat evaluation.  Patient's last tetanus shot is more than 5 years and therefore we will update Tdap.  Abrasions to bilateral knees do not require any sutures.  Patient care discussed with attending physician Dr. Rubin Payor.  Based on patient's chief complaint, I considered admission might be necessary, however after reassuring ED workup feel patient is reasonable for discharge.  Discussed results, findings, treatment and follow up. Patient advised of return precautions. Patient verbalized understanding and agreed with plan.  Portions of this note were generated with Scientist, clinical (histocompatibility and immunogenetics). Dictation errors may occur despite best attempts at proofreading.         Final Clinical Impression(s) / ED Diagnoses Final diagnoses:  None    Rx / DC Orders ED Discharge Orders     None         Haskel Schroeder, PA-C 08/24/21 0850     Benjiman Core, MD 08/24/21 [phone]

## 2021-08-27 ENCOUNTER — Other Ambulatory Visit: Payer: Self-pay | Admitting: Adult Medicine

## 2021-08-27 DIAGNOSIS — Z1231 Encounter for screening mammogram for malignant neoplasm of breast: Secondary | ICD-10-CM

## 2021-08-30 ENCOUNTER — Emergency Department (HOSPITAL_COMMUNITY): Payer: Federal, State, Local not specified - PPO

## 2021-08-30 ENCOUNTER — Encounter (HOSPITAL_COMMUNITY): Payer: Self-pay

## 2021-08-30 ENCOUNTER — Emergency Department (HOSPITAL_COMMUNITY)
Admission: EM | Admit: 2021-08-30 | Discharge: 2021-08-30 | Disposition: A | Discharge status: Home / Self Care | Payer: Federal, State, Local not specified - PPO | Attending: Emergency Medicine | Admitting: Emergency Medicine

## 2021-08-30 ENCOUNTER — Other Ambulatory Visit: Payer: Self-pay

## 2021-08-30 DIAGNOSIS — Z79899 Other long term (current) drug therapy: Secondary | ICD-10-CM | POA: Diagnosis not present

## 2021-08-30 DIAGNOSIS — Z7982 Long term (current) use of aspirin: Secondary | ICD-10-CM | POA: Diagnosis not present

## 2021-08-30 DIAGNOSIS — R079 Chest pain, unspecified: Secondary | ICD-10-CM | POA: Diagnosis present

## 2021-08-30 LAB — BASIC METABOLIC PANEL
Anion gap: 12 (ref 5–15)
BUN: 10 mg/dL (ref 8–23)
CO2: 23 mmol/L (ref 22–32)
Calcium: 9.9 mg/dL (ref 8.9–10.3)
Chloride: 106 mmol/L (ref 98–111)
Creatinine, Ser: 0.92 mg/dL (ref 0.44–1.00)
GFR, Estimated: >60 mL/min (ref >60)
Glucose, Bld: 141 mg/dL — ABNORMAL HIGH (ref 70–99)
Potassium: 4.5 mmol/L (ref 3.5–5.1)
Sodium: 141 mmol/L (ref 135–145)

## 2021-08-30 LAB — CBC
HCT: 37.5 % (ref 36.0–46.0)
Hemoglobin: 12.8 g/dL (ref 12.0–15.0)
MCH: 29.8 pg (ref 26.0–34.0)
MCHC: 34.1 g/dL (ref 30.0–36.0)
MCV: 87.4 fL (ref 80.0–100.0)
Platelets: 220 10*3/uL (ref 150–400)
RBC: 4.29 MIL/uL (ref 3.87–5.11)
RDW: 13.7 % (ref 11.5–15.5)
WBC: 6.1 10*3/uL (ref 4.0–10.5)
nRBC: 0 % (ref 0.0–0.2)

## 2021-08-30 LAB — TROPONIN I (HIGH SENSITIVITY)
Troponin I (High Sensitivity): <2 ng/L (ref <18)
Troponin I (High Sensitivity): <2 ng/L (ref <18)

## 2021-08-30 NOTE — Discharge Instructions (Signed)
Please follow-up with your primary care doctor.  Discussed the symptoms from today.  Additionally follow-up with the cardiologist that you are not referred to later this month.  If you have recurrent chest pain, difficulty breathing or other new concerning symptom, come back to ER for reassessment.

## 2021-08-30 NOTE — ED Provider Triage Note (Signed)
Emergency Medicine Provider Triage Evaluation Note  Kelly Fitzpatrick , a 61 y.o. female  was evaluated in triage.  Pt complains of chest pains.  Reports that they have been going on since yesterday.  Thought it was because she ate dry and fried fish but they continue to happen today.  No cardiac history.  Is set to see a cardiologist next week secondary to her fall last week.  Review of Systems  Positive: Chest pains Negative: Palpitations, shortness of breath  Physical Exam  BP 124/80 (BP Location: Left Arm)   Pulse 97   Temp 98.3 F (36.8 C) (Oral)   Resp 18   Ht 5\' 2"  (1.575 m)   Wt 62.1 kg   SpO2 99%   BMI 25.06 kg/m  Gen:   Awake, no distress   Resp:  Normal effort  MSK:   Moves extremities without difficulty  Other:  RRR  Medical Decision Making  Medically screening exam initiated at 11:41 AM.  Appropriate orders placed.  Kelly Fitzpatrick was informed that the remainder of the evaluation will be completed by another provider, this initial triage assessment does not replace that evaluation, and the importance of remaining in the ED until their evaluation is complete.     [name], PA-C 08/30/21 1142

## 2021-08-30 NOTE — ED Triage Notes (Signed)
Pt arrived POV from home c/o left sided CP that started last night with no radiation. Pt denies any SHOB, N/V.

## 2021-08-30 NOTE — ED Provider Notes (Signed)
[facility] EMERGENCY DEPARTMENT Provider Note   CSN: [id number] Arrival date & time: 08/30/21  1047     History  Chief Complaint  Patient presents with   Chest Pain    Kelly Fitzpatrick is a 61 y.o. female.  Presents emerged part due to concern for chest pain.  Pain has been going on since yesterday.  Seems to be intermittent, not associated with exertion, occurring at rest.  Last for few seconds at a time.  Burning sensation.  Slight discomfort.  Not currently having any pain.  No associated difficulty in breathing.  HPI     Home Medications Prior to Admission medications   Medication Sig Start Date End Date Taking? Authorizing Provider  aspirin 81 MG chewable tablet Chew 81 mg by mouth daily.    [provider]  Blood Pressure Monitor DEVI 1 Units by Does not apply route daily as needed. 11/02/17   Benjiman Core D, PA-C  Calcium Carbonate (CALCIUM 600 PO) Take by mouth.    [provider]  cholecalciferol (VITAMIN D) 400 UNITS TABS Take 400 Units by mouth daily.    [provider]  fluticasone (FLONASE) 50 MCG/ACT nasal spray Place 1 spray into both nostrils daily. 06/22/20   Leslye Peer, MD  Garlic 500 MG CAPS Take 500 mg by mouth daily. Garlique    [provider]  glucose blood (FREESTYLE PRECISION NEO TEST) test strip Use as instructed 11/02/17   Benjiman Core D, PA-C  Lancets MISC To check blood sugar daily dx code E11.65 05/15/17   Lezlie Lye, Meda Coffee, MD  lisinopril (ZESTRIL) 5 MG tablet TAKE 1 TABLET BY MOUTH EVERY DAY Patient not taking: Reported on 04/13/2021 08/07/19   Lezlie Lye, Meda Coffee, MD  loratadine (CLARITIN) 10 MG tablet Take 10 mg by mouth daily. 24 hour    [provider]  losartan (COZAAR) 25 MG tablet Take 25 mg by mouth daily. 01/18/21   [provider]  metFORMIN (GLUCOPHAGE-XR) 500 MG 24 hr tablet Take 1 tablet (500 mg total) by mouth 2 (two) times daily. 06/22/20   Leslye Peer, MD  montelukast (SINGULAIR) 10 MG tablet Take 10 mg by mouth at bedtime. 01/26/21   [provider]  naproxen sodium (ALEVE) 220 MG tablet Take 220 mg by mouth at bedtime as needed (pain).    [provider]  NON Bailey Mech given by [employer]BS finger stick reader    [provider]  ONE TOUCH ULTRA TEST test strip 1 EACH BY OTHER ROUTE DAILY. DX E11.65 11/07/17   Benjiman Core D, PA-C  pravastatin (PRAVACHOL) 40 MG tablet TAKE 1 TABLET BY MOUTH EVERY DAY Patient taking differently: Take 40 mg by mouth daily. 08/10/19   Erasmo Downer, MD      Allergies    Amoxicillin, Cephalexin, Hyoscyamine sulfate, and Sulfa antibiotics    Review of Systems   Review of Systems  Constitutional:  Negative for chills and fever.  HENT:  Negative for ear pain and sore throat.   Eyes:  Negative for pain and visual disturbance.  Respiratory:  Negative for cough and shortness of breath.   Cardiovascular:  Positive for chest pain. Negative for palpitations.  Gastrointestinal:  Negative for abdominal pain and vomiting.  Genitourinary:  Negative for dysuria and hematuria.  Musculoskeletal:  Negative for arthralgias and back pain.  Skin:  Negative for color change and rash.  Neurological:  Negative for seizures and syncope.  All other  systems reviewed and are negative.   Physical Exam Updated Vital Signs BP 112/64   Pulse 74   Temp 98.3 F (36.8 C) (Oral)   Resp 16   Ht 5\' 2"  (1.575 m)   Wt 62.1 kg   SpO2 100%   BMI 25.06 kg/m  Physical Exam Vitals and nursing note reviewed.  Constitutional:      General: She is not in acute distress.    Appearance: She is well-developed.  HENT:     Head: Normocephalic and atraumatic.  Eyes:     Conjunctiva/sclera: Conjunctivae normal.  Cardiovascular:     Rate and Rhythm: Normal rate and regular rhythm.     Heart sounds: No murmur heard. Pulmonary:     Effort: Pulmonary effort is normal. No respiratory distress.      Breath sounds: Normal breath sounds.  Abdominal:     Palpations: Abdomen is soft.     Tenderness: There is no abdominal tenderness.  Musculoskeletal:        General: No swelling.     Cervical back: Neck supple.  Skin:    General: Skin is warm and dry.     Capillary Refill: Capillary refill takes less than 2 seconds.  Neurological:     Mental Status: She is alert.  Psychiatric:        Mood and Affect: Mood normal.     ED Results / Procedures / Treatments   Labs (all labs ordered are listed, but only abnormal results are displayed) Labs Reviewed  BASIC METABOLIC PANEL - Abnormal; Notable for the following components:      Result Value   Glucose, Bld 141 (*)    All other components within normal limits  CBC  TROPONIN I (HIGH SENSITIVITY)  TROPONIN I (HIGH SENSITIVITY)    EKG EKG Interpretation  Date/Time:  Monday August 30 2021 11:15:48 EDT Ventricular Rate:  82 PR Interval:  142 QRS Duration: 64 QT Interval:  348 QTC Calculation: 406 R Axis:   25 Text Interpretation: Normal sinus rhythm Septal infarct , age undetermined Abnormal ECG When compared with ECG of 23-Aug-2021 23:58, No significant change since last tracing Confirmed by 25-Aug-2021 (Marianna Fuss) on 08/30/2021 4:05:09 PM  Radiology DG Chest 2 View  Result Date: 08/30/2021 CLINICAL DATA:  cp EXAM: CHEST - 2 VIEW COMPARISON:  March 21, 2020 FINDINGS: The heart size and mediastinal contours are within normal limits. Both lungs are clear. Moderate thoracic spondylosis. IMPRESSION: No active cardiopulmonary disease. Electronically Signed   By: March 23, 2020 M.D.   On: 08/30/2021 12:04    Procedures Procedures    Medications Ordered in ED Medications - No data to display  ED Course/ Medical Decision Making/ A&P                           Medical Decision Making  61 year old lady presented to ER due to concern for chest pain.  On my exam she is well-appearing in no distress with normal vital signs.  EKG  without acute ischemic change, troponin x2 is within normal limits, undetectable, doubt ACS.  She had a CTA coronary study completed in 2021 that was negative.  Given no ongoing pain and reassuring work-up today, feel patient is appropriate for outpatient management.  She states that for unrelated reason she has been referred to see a cardiologist already and has an appointment later this month.  Encourage patient to keep this appointment.  Independently reviewed and interpreted CXR, no  cardiomegaly, no edema, no infiltrate.  No anemia, no electrolyte derangement.  Reviewed return precautions and discharged.    After the discussed management above, the patient was determined to be safe for discharge.  The patient was in agreement with this plan and all questions regarding their care were answered.  ED return precautions were discussed and the patient will return to the ED with any significant worsening of condition.         Final Clinical Impression(s) / ED Diagnoses Final diagnoses:  Chest pain, unspecified type    Rx / DC Orders ED Discharge Orders     None         Milagros Loll, MD 08/30/21 1636

## 2021-10-11 ENCOUNTER — Ambulatory Visit: Payer: Federal, State, Local not specified - PPO | Attending: Audiologist | Admitting: Audiologist

## 2021-10-11 DIAGNOSIS — E1140 Type 2 diabetes mellitus with diabetic neuropathy, unspecified: Secondary | ICD-10-CM | POA: Diagnosis present

## 2021-10-11 DIAGNOSIS — H903 Sensorineural hearing loss, bilateral: Secondary | ICD-10-CM | POA: Insufficient documentation

## 2021-10-11 NOTE — Procedures (Signed)
  Outpatient Audiology and [facility] [address] [place], [place]  [address] [phone]  AUDIOLOGICAL  EVALUATION  NAME: Kelly Fitzpatrick     DOB:   1960-07-05      MRN: [id number]                                                                                     DATE: 10/11/2021     REFERENT: Eliezer Lofts, MD STATUS: Outpatient DIAGNOSIS: Sensorineural Hearing Loss, Tinnitus    History: Kelly Fitzpatrick was seen for an audiological evaluation. Kelly Fitzpatrick is receiving a hearing evaluation due to concerns for a fizzing and 'zip' sound in both ears. A month ago Kelly Fitzpatrick started noticing a fizzing sound when she lays down in quiet. She also has random feelings of a 'zip' going through her head, it also makes a sound. Kelly Fitzpatrick has no difficulty hearing in either ear. These sounds were noticed a month ago and gradually has gotten louder. No pain or pressure reported in either ear. Kelly Fitzpatrick has been diagnosed with neuropathy. She had a fall in July that her doctor believes is due to imbalance from neuropathy. Kelly Fitzpatrick has diabetes which is controlled. No other relevant case history reported.   Evaluation:  Otoscopy showed a clear view of the tympanic membranes, bilaterally Tympanometry results were consistent with normal middle ear function, bilaterally   Audiometric testing was completed using conventional audiometry with supraural high pitched transducer. Speech Recognition Thresholds were 10dB in the right ear and 15dB in the left ear. Word Recognition was performed 40dB SL, scored  100% in the right ear and 100% in the left ear. Pure tone thresholds show normal hearing sloping after 6kHz to a severe sensorineural  hearing loss in each ear. High frequency thresholds show loss stays at severe level until [facility].   Results:  The test results were reviewed with Kelly Fitzpatrick. Kelly Fitzpatrick noted that the high pitched tones used in testing sound like her 'fizzing'. She was instructed that her neuropathy has impacted her  hearing, causing a drop in her high pitched hearing. This will not impact her ability to hear speech. She will likely continue to have come fizzing as the brain adjusts to the loss of this high pitched hearing. She needs to mask the fizzing with a box fan, white noise, or other calming sound. Do not fixate on it. Kelly Fitzpatrick reported understanding. She needs to start getting annual hearing tests to  monitor her hearing loss progression due to diabetic neuropathy.    Recommendations: 1.   Recommend audiologic testing annually due to high pitched hearing loss from neuropathy.    32 minutes spent testing and counseling on results.   Kelly Fitzpatrick  Audiologist, Au.D., CCC-A 10/11/2021  2:27 PM  Cc: Eliezer Lofts, MD

## 2021-10-24 IMAGING — DX DG CERVICAL SPINE COMPLETE 4+V
5 series · 5 of 5 positions shown · non-contrast
Comparison: None.

CLINICAL DATA: Neck pain and radiculopathy

EXAM:
CERVICAL SPINE - COMPLETE 4+ VIEW

[c-spine lat]
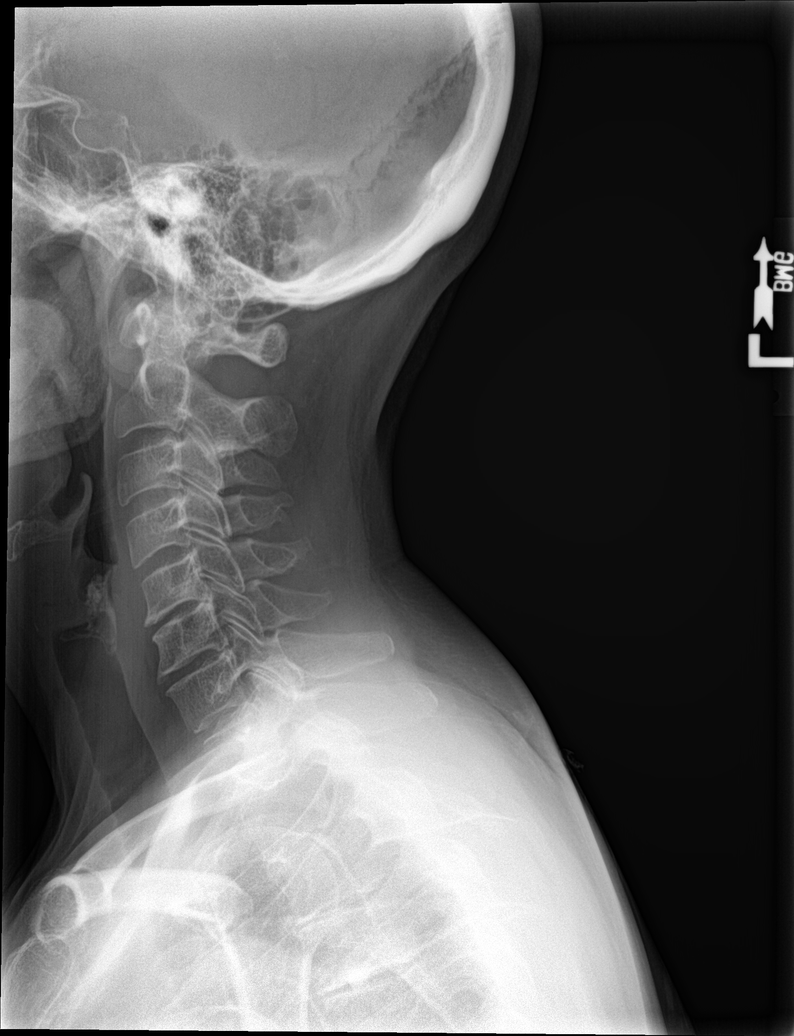

[c-spine ap]
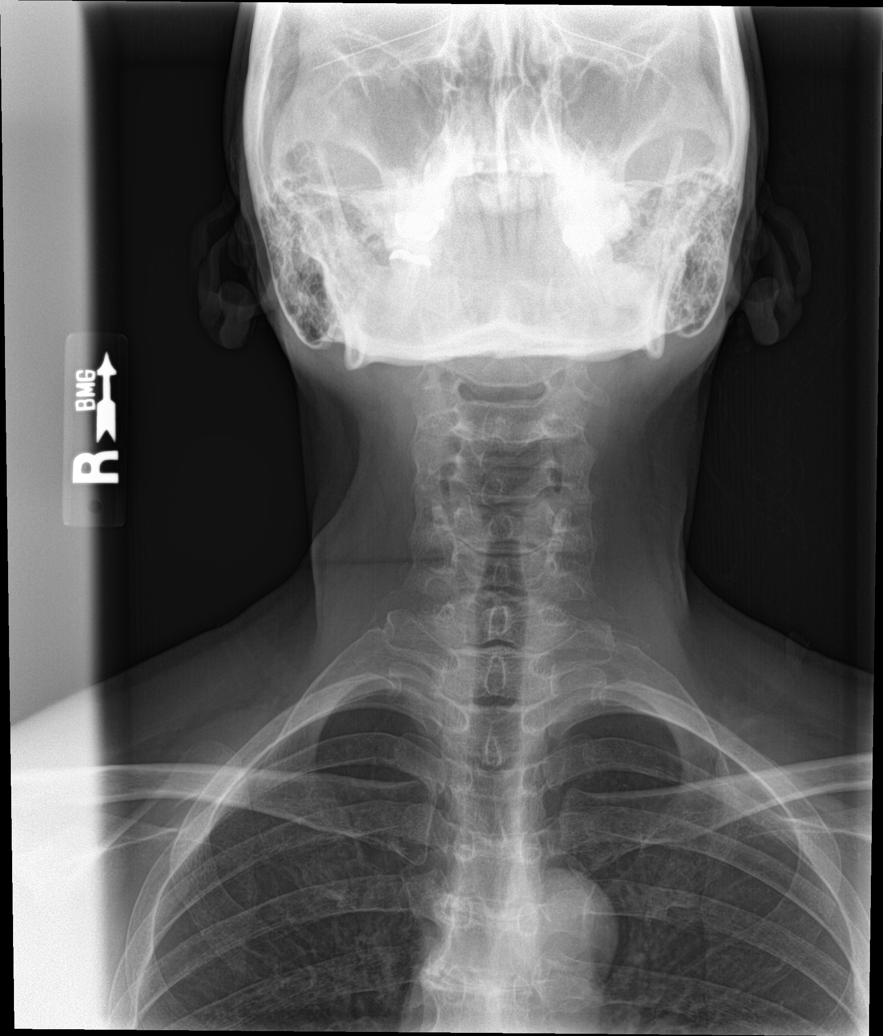

[c-spine open mouth]
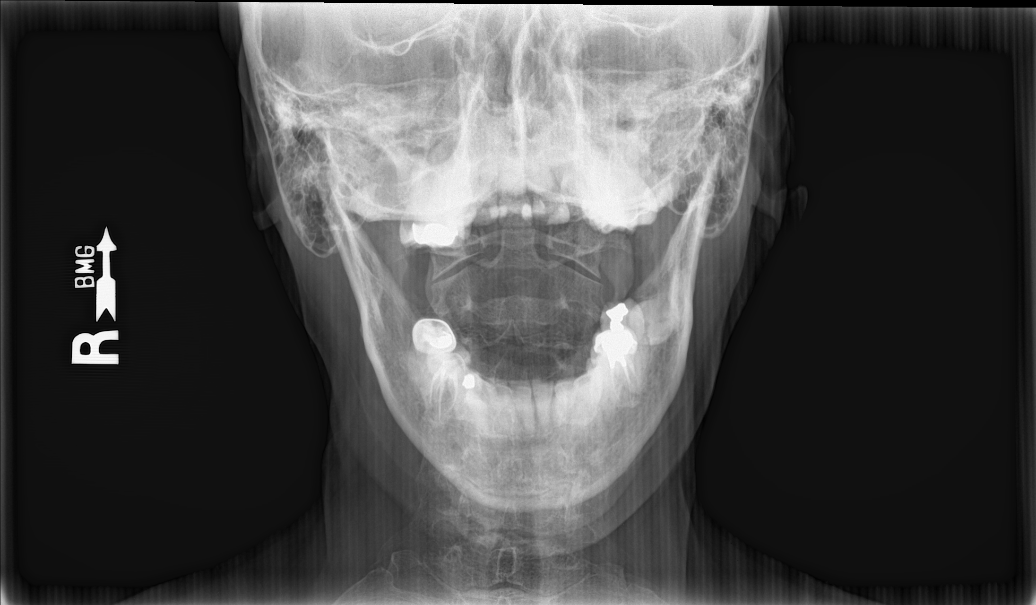

[c-spine flex]
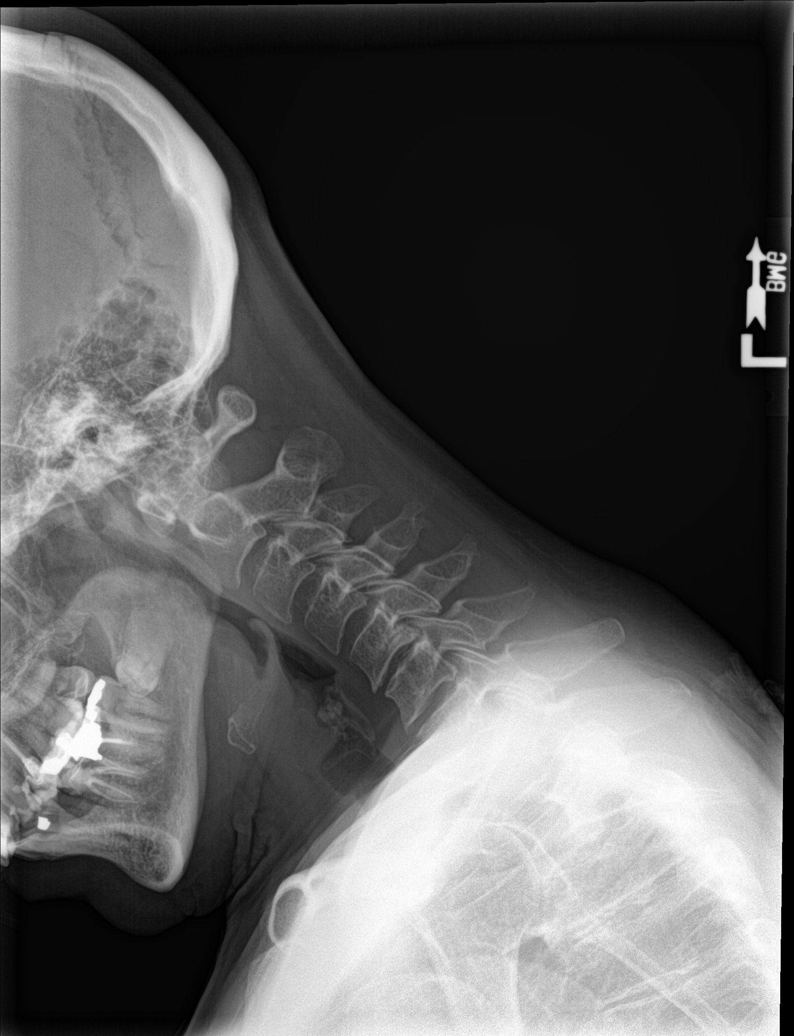

[c-spine ext]
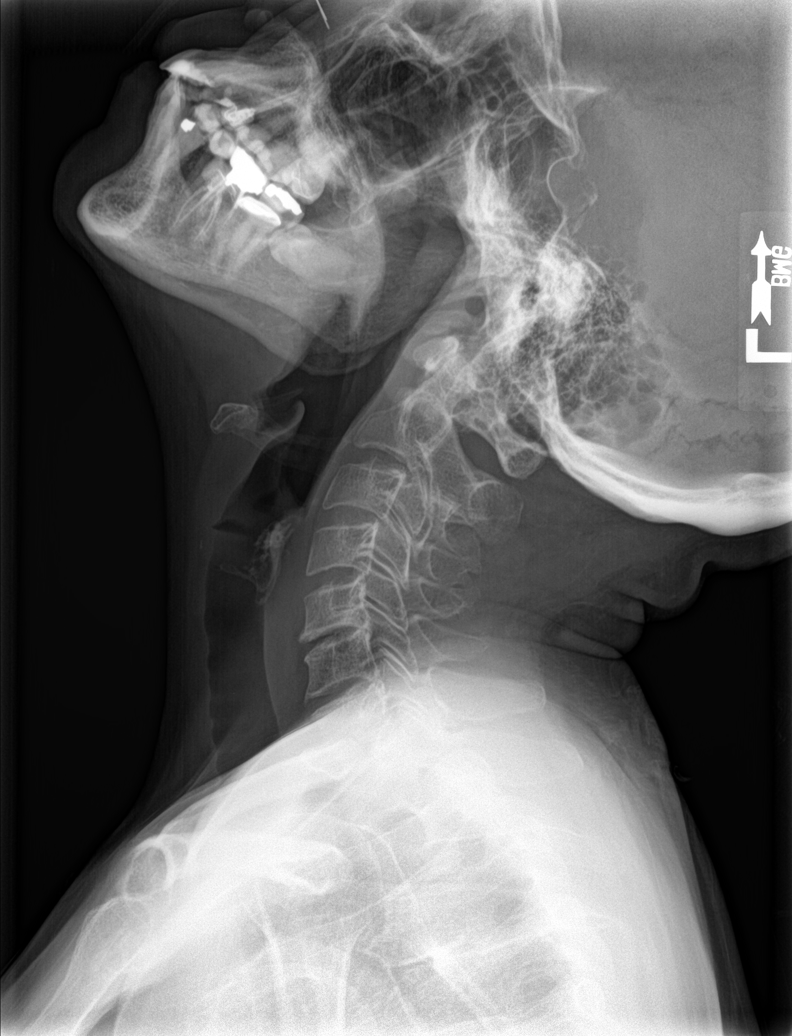

[5 of 5 positions shown; findings below may reference images not displayed]

FINDINGS: Seven cervical segments are well visualized. Mild osteophytic
changes are noted at C5-6 and C6-7. Mild disc space narrowing is
noted as well. No soft tissue abnormality is noted. The odontoid is
within normal limits. Flexion and extension views show minimal
anterolisthesis of C4 on C5 on flexion. No other focal abnormality
is noted.
IMPRESSION: Mild degenerative anterolisthesis of C4 on C5 on flexion. No other
focal abnormality is noted.

## 2021-11-01 ENCOUNTER — Ambulatory Visit
Admission: RE | Admit: 2021-11-01 | Discharge: 2021-11-01 | Disposition: A | Discharge status: Home / Self Care | Payer: Federal, State, Local not specified - PPO | Source: Ambulatory Visit | Attending: Adult Medicine | Admitting: Adult Medicine

## 2021-11-01 DIAGNOSIS — Z1231 Encounter for screening mammogram for malignant neoplasm of breast: Secondary | ICD-10-CM

## 2022-06-09 ENCOUNTER — Ambulatory Visit (INDEPENDENT_AMBULATORY_CARE_PROVIDER_SITE_OTHER): Payer: Federal, State, Local not specified - PPO

## 2022-06-09 ENCOUNTER — Ambulatory Visit
Admission: EM | Admit: 2022-06-09 | Discharge: 2022-06-09 | Disposition: A | Discharge status: Home / Self Care | Payer: Federal, State, Local not specified - PPO | Attending: Urgent Care | Admitting: Urgent Care

## 2022-06-09 DIAGNOSIS — R222 Localized swelling, mass and lump, trunk: Secondary | ICD-10-CM | POA: Diagnosis not present

## 2022-06-09 DIAGNOSIS — D869 Sarcoidosis, unspecified: Secondary | ICD-10-CM | POA: Diagnosis not present

## 2022-06-09 NOTE — Discharge Instructions (Signed)
I will let you know about your x-ray result and blood cell counts as they come back to me at some point tomorrow.

## 2022-06-09 NOTE — ED Notes (Signed)
Pt back to waiting room and pulled to a room.

## 2022-06-09 NOTE — ED Notes (Signed)
Called patient to place in room. States she has to take her Metformin at this time with some food and went out to parking lot to her car.

## 2022-06-09 NOTE — ED Provider Notes (Signed)
[facility]  Note:  This document was prepared using Conservation officer, historic buildings and may include unintentional dictation errors.  MRN: [id number] DOB: 11/16/1960  Subjective:   Kelly Fitzpatrick is a 62 y.o. female presenting for consultation regarding a possible knot over the right clavicular bone.  Has noticed this in the past few weeks.  Denies any pain, drainage of pus or bleeding, warmth, erythema, bruising.  She is concerned because she has a history of sarcoidosis and is being monitored now as opposed to undergoing active treatment.  No particular fall or trauma to the right clavicular bone.  She does have a history of a pinched nerve in her neck, cervical spondylosis with myelopathy and radiculopathy.  Gets periodic spasms but this is unchanged.  No unexplained fevers, night sweats, weight loss, masses.  No current facility-administered medications for this encounter.  Current Outpatient Medications:    aspirin 81 MG chewable tablet, Chew 81 mg by mouth daily., Disp: , Rfl:    Blood Pressure Monitor DEVI, 1 Units by Does not apply route daily as needed., Disp: 1 Device, Rfl: 0   Calcium Carbonate (CALCIUM 600 PO), Take by mouth., Disp: , Rfl:    cholecalciferol (VITAMIN D) 400 UNITS TABS, Take 400 Units by mouth daily., Disp: , Rfl:    fluticasone (FLONASE) 50 MCG/ACT nasal spray, Place 1 spray into both nostrils daily., Disp: , Rfl:    Garlic 500 MG CAPS, Take 500 mg by mouth daily. Garlique, Disp: , Rfl:    glucose blood (FREESTYLE PRECISION NEO TEST) test strip, Use as instructed, Disp: 50 each, Rfl: 2   Lancets MISC, To check blood sugar daily dx code E11.65, Disp: 100 each, Rfl: 0   lisinopril (ZESTRIL) 5 MG tablet, TAKE 1 TABLET BY MOUTH EVERY DAY (Patient not taking: Reported on 04/13/2021), Disp: 90 tablet, Rfl: 0   loratadine (CLARITIN) 10 MG tablet, Take 10 mg by mouth daily. 24 hour, Disp: , Rfl:    losartan (COZAAR) 25 MG tablet, Take 25 mg by  mouth daily., Disp: , Rfl:    metFORMIN (GLUCOPHAGE-XR) 500 MG 24 hr tablet, Take 1 tablet (500 mg total) by mouth 2 (two) times daily., Disp: , Rfl:    montelukast (SINGULAIR) 10 MG tablet, Take 10 mg by mouth at bedtime., Disp: , Rfl:    naproxen sodium (ALEVE) 220 MG tablet, Take 220 mg by mouth at bedtime as needed (pain)., Disp: , Rfl:    NON FORMULARY, [place] given by [employer] finger stick reader, Disp: , Rfl:    ONE TOUCH ULTRA TEST test strip, 1 EACH BY OTHER ROUTE DAILY. DX E11.65, Disp: 100 each, Rfl: 0   pravastatin (PRAVACHOL) 40 MG tablet, TAKE 1 TABLET BY MOUTH EVERY DAY (Patient taking differently: Take 40 mg by mouth daily.), Disp: 90 tablet, Rfl: 2   Allergies  Allergen Reactions   Amoxicillin Rash   Cephalexin Rash   Hyoscyamine Sulfate Rash    Patient not sure about this.   Sulfa Antibiotics Rash    Past Medical History:  Diagnosis Date   Allergy    Anemia    Diabetes mellitus    Type II   Endometriosis    Hypercholesteremia    Pinched nerve in neck    Sickle cell trait      Past Surgical History:  Procedure Laterality Date   ABDOMINAL HYSTERECTOMY     BRONCHIAL BRUSHINGS  06/22/2020   Procedure: BRONCHIAL BRUSHINGS;  Surgeon: Leslye Peer,  MD;  Location: [facility];  Service: Cardiopulmonary;;   BRONCHIAL WASHINGS  06/22/2020   Procedure: BRONCHIAL WASHINGS;  Surgeon: Leslye Peer, MD;  Location: [facility];  Service: Cardiopulmonary;;   COLON SURGERY     COLONOSCOPY     COLOSTOMY     COLOSTOMY CLOSURE     VIDEO BRONCHOSCOPY WITH ENDOBRONCHIAL ULTRASOUND N/A 06/22/2020   Procedure: VIDEO BRONCHOSCOPY WITH ENDOBRONCHIAL ULTRASOUND;  Surgeon: Leslye Peer, MD;  Location: [facility];  Service: Cardiopulmonary;  Laterality: N/A;    Family History  Problem Relation Age of Onset   Hypertension Mother    Cancer Mother        breast cancer   Breast cancer Mother 32   Cancer Father        lung cancer   Multiple sclerosis Brother    Heart disease  Brother        heart transplant   Breast cancer Maternal Aunt     Social History   Tobacco Use   Smoking status: Never   Smokeless tobacco: Never  Vaping Use   Vaping Use: Never used  Substance Use Topics   Alcohol use: No   Drug use: No    ROS   Objective:   Vitals: BP (!) 142/85 (BP Location: Left Arm)   Pulse 93   Temp 97.9 F (36.6 C) (Oral)   Resp 16   SpO2 97%   Physical Exam Constitutional:      General: She is not in acute distress.    Appearance: Normal appearance. She is well-developed. She is not ill-appearing, toxic-appearing or diaphoretic.  HENT:     Head: Normocephalic and atraumatic.     Nose: Nose normal.     Mouth/Throat:     Mouth: Mucous membranes are moist.  Eyes:     General: No scleral icterus.       Right eye: No discharge.        Left eye: No discharge.     Extraocular Movements: Extraocular movements intact.  Cardiovascular:     Rate and Rhythm: Normal rate and regular rhythm.     Heart sounds: Normal heart sounds. No murmur heard.    No friction rub. No gallop.  Pulmonary:     Effort: Pulmonary effort is normal. No respiratory distress.     Breath sounds: No stridor. No wheezing, rhonchi or rales.  Chest:     Chest wall: No tenderness.    Skin:    General: Skin is warm and dry.  Neurological:     General: No focal deficit present.     Mental Status: She is alert and oriented to person, place, and time.  Psychiatric:        Mood and Affect: Mood normal.        Behavior: Behavior normal.     Assessment and Plan :   PDMP not reviewed this encounter.  1. Clavicular area fullness   2. Sarcoidosis    X-ray over-read was pending at time of discharge, recommended follow up with only abnormal results. Otherwise will not call for negative over-read. Patient was in agreement.  No obvious physical exam findings to explain her symptoms.  Vital signs hemodynamically stable for outpatient management.  Will pursue CBC.  As patient is  here closing, recommended follow-up with abnormal findings tomorrow.  Otherwise recheck with PCP.   Wallis Bamberg, [place] 06/09/22 1956

## 2022-06-09 NOTE — ED Triage Notes (Signed)
Pt states some right arm pain and swelling to her right clavicle for the past few weeks.

## 2022-06-11 LAB — CBC
Hematocrit: 40.3 % (ref 34.0–46.6)
Hemoglobin: 13.2 g/dL (ref 11.1–15.9)
MCH: 29.4 pg (ref 26.6–33.0)
MCHC: 32.8 g/dL (ref 31.5–35.7)
MCV: 90 fL (ref 79–97)
Platelets: 193 10*3/uL (ref 150–450)
RBC: 4.49 x10E6/uL (ref 3.77–5.28)
RDW: 14.6 % (ref 11.7–15.4)
WBC: 5.9 10*3/uL (ref 3.4–10.8)

## 2022-10-05 ENCOUNTER — Other Ambulatory Visit: Payer: Self-pay | Admitting: Adult Medicine

## 2022-10-05 DIAGNOSIS — Z0000 Encounter for general adult medical examination without abnormal findings: Secondary | ICD-10-CM

## 2022-11-03 ENCOUNTER — Ambulatory Visit
Admission: RE | Admit: 2022-11-03 | Discharge: 2022-11-03 | Disposition: A | Discharge status: Home / Self Care | Payer: Federal, State, Local not specified - PPO | Source: Ambulatory Visit | Attending: Adult Medicine | Admitting: Adult Medicine

## 2022-11-03 ENCOUNTER — Ambulatory Visit: Payer: Federal, State, Local not specified - PPO

## 2022-11-03 DIAGNOSIS — Z0000 Encounter for general adult medical examination without abnormal findings: Secondary | ICD-10-CM

## 2023-07-19 ENCOUNTER — Encounter: Payer: Self-pay | Admitting: Adult Medicine

## 2023-07-19 DIAGNOSIS — Z1231 Encounter for screening mammogram for malignant neoplasm of breast: Secondary | ICD-10-CM

## 2023-08-23 ENCOUNTER — Ambulatory Visit: Admission: EM | Admit: 2023-08-23 | Discharge: 2023-08-23 | Disposition: A | Discharge status: Home / Self Care

## 2023-08-23 ENCOUNTER — Encounter: Payer: Self-pay | Admitting: Emergency Medicine

## 2023-08-23 DIAGNOSIS — R42 Dizziness and giddiness: Secondary | ICD-10-CM

## 2023-08-23 DIAGNOSIS — H1132 Conjunctival hemorrhage, left eye: Secondary | ICD-10-CM

## 2023-08-23 MED ORDER — MECLIZINE HCL 25 MG PO TABS: 25.0000 mg | ORAL_TABLET | Freq: Every day | ORAL | 0 refills | Status: AC

## 2023-08-23 NOTE — ED Provider Notes (Signed)
 [facility]    CSN: [id number] Arrival date & time: 08/23/23  1542      History   Chief Complaint Chief Complaint  Patient presents with   Dizziness   Eye Problem    HPI Kelly Fitzpatrick is a 63 y.o. female.   Discussed the use of AI scribe software for clinical note transcription with the patient, who gave verbal consent to proceed.   Kelly Fitzpatrick is a 63 y.o. female that presents with multiple concerns including blood in the right eye, dizziness and lightheadedness. The patient noticed blood in their right eye today, which is not associated with any pain or itching. For the past several days, they have experienced dizziness and lightheadedness upon waking early in the morning, typically around 3:30 or 4:00 AM, when getting up to use the bathroom. The patient reports feeling dizzy while lying in bed and lightheaded when standing up, but these symptoms resolve quickly and do not persist throughout the day. Additionally, the patient reports numbness in their hands and arms during sleep.  The patient recently had a regular check-up with their primary care physician, which included blood work and a urine test. They have a follow-up appointment scheduled for Monday, July 7th, but wanted to ensure nothing serious was occurring before that appointment. The patient denies any recent sickness, chest pains, shortness of breath, palpitations, swelling, blood in urine or stool, problems with speech, headaches, weakness, or discomfort when using the restroom. They report their urine color as regular pale yellow. Last A1c was 6.5%. The patient denies starting any new medications recently.  The following portions of the patient's history were reviewed and updated as appropriate: allergies, current medications, past family history, past medical history, past social history, past surgical history, and problem list.    Past Medical History:  Diagnosis Date   Allergy    Anemia     Diabetes mellitus    Type II   Endometriosis    Hypercholesteremia    Pinched nerve in neck    Sickle cell trait [facility])     Patient Active Problem List   Diagnosis Date Noted   Sarcoidosis 04/23/2020   Chest pain 06/11/2019   Cervical spondylosis with myelopathy and radiculopathy 06/11/2019   Encounter for screening mammogram for malignant neoplasm of breast 06/11/2019   Nasal congestion 06/11/2019   Screening for colon cancer 06/11/2019   Annual physical exam 06/11/2019   Controlled type 2 diabetes mellitus with neuropathy (HCC) 06/11/2019   Hyperlipidemia 06/20/2012   Diabetes (HCC) 06/20/2012    Past Surgical History:  Procedure Laterality Date   ABDOMINAL HYSTERECTOMY     BRONCHIAL BRUSHINGS  06/22/2020   Procedure: BRONCHIAL BRUSHINGS;  Surgeon: Shelah Lamar RAMAN, MD;  Location: [facility] ENDOSCOPY;  Service: Cardiopulmonary;;   BRONCHIAL WASHINGS  06/22/2020   Procedure: BRONCHIAL WASHINGS;  Surgeon: Shelah Lamar RAMAN, MD;  Location: [facility] ENDOSCOPY;  Service: Cardiopulmonary;;   COLON SURGERY     COLONOSCOPY     COLOSTOMY     COLOSTOMY CLOSURE     VIDEO BRONCHOSCOPY WITH ENDOBRONCHIAL ULTRASOUND N/A 06/22/2020   Procedure: VIDEO BRONCHOSCOPY WITH ENDOBRONCHIAL ULTRASOUND;  Surgeon: Shelah Lamar RAMAN, MD;  Location: [facility] ENDOSCOPY;  Service: Cardiopulmonary;  Laterality: N/A;    OB History   No obstetric history on file.      Home Medications    Prior to Admission medications   Medication Sig Start Date End Date Taking? Authorizing Provider  aspirin 81 MG chewable tablet Chew 81 mg  by mouth daily.   Yes [provider]  Blood Glucose Monitoring Suppl (ONETOUCH VERIO FLEX SYSTEM) w/Device KIT See admin instructions. 06/27/23  Yes [provider]  Blood Pressure Monitor DEVI 1 Units by Does not apply route daily as needed. 11/02/17  Yes Starla, Brittany D, PA-C  Calcium Carbonate (CALCIUM 600 PO) Take by mouth.   Yes [provider]  cholecalciferol (VITAMIN D )  400 UNITS TABS Take 400 Units by mouth daily.   Yes [provider]  Garlic 500 MG CAPS Take 500 mg by mouth daily. Garlique   Yes [provider]  glucose blood (FREESTYLE PRECISION NEO TEST) test strip Use as instructed 11/02/17  Yes Starla, [place] D, PA-C  Lancets MISC To check blood sugar daily dx code E11.65 05/15/17  Yes Melonie Colonel, Mikel HERO, MD  loratadine (CLARITIN) 10 MG tablet Take 10 mg by mouth daily. 24 hour   Yes [provider]  losartan (COZAAR) 50 MG tablet Take 50 mg by mouth daily. 06/08/23  Yes [provider]  meclizine (ANTIVERT) 25 MG tablet Take 1 tablet (25 mg total) by mouth at bedtime. 08/23/23  Yes Iola Lukes, FNP  metFORMIN  (GLUCOPHAGE -XR) 500 MG 24 hr tablet Take 1 tablet (500 mg total) by mouth 2 (two) times daily. 06/22/20  Yes Shelah Lamar RAMAN, MD  montelukast (SINGULAIR) 10 MG tablet Take 10 mg by mouth at bedtime. 01/26/21  Yes [provider]  ONE TOUCH ULTRA TEST test strip 1 EACH BY OTHER ROUTE DAILY. DX E11.65 11/07/17  Yes Starla, Brittany D, PA-C  pravastatin  (PRAVACHOL ) 40 MG tablet TAKE 1 TABLET BY MOUTH EVERY DAY Patient taking differently: Take 40 mg by mouth daily. 08/10/19  Yes Bacigalupo, Angela M, MD  valACYclovir (VALTREX) 1000 MG tablet Take 1,000 mg by mouth daily. 08/08/23  Yes [provider]  Vitamin D , Ergocalciferol , (DRISDOL) 1.25 MG (50000 UNIT) CAPS capsule Take 50,000 Units by mouth once a week. 06/27/23  Yes [provider]  doxycycline  (VIBRAMYCIN ) 100 MG capsule Take 100 mg by mouth 2 (two) times daily. Patient not taking: Reported on 08/23/2023 06/28/23   [provider]  fluticasone  (FLONASE ) 50 MCG/ACT nasal spray Place 1 spray into both nostrils daily. Patient not taking: Reported on 08/23/2023 06/22/20   Byrum, Robert S, MD  gabapentin  (NEURONTIN ) 100 MG capsule Take 100 mg by mouth 2 (two) times daily as needed. Patient not taking: Reported on 08/23/2023 03/26/23    [provider]  lisinopril  (ZESTRIL ) 5 MG tablet TAKE 1 TABLET BY MOUTH EVERY DAY Patient not taking: Reported on 04/13/2021 08/07/19   Melonie Colonel, Mikel HERO, MD  naproxen  sodium (ALEVE ) 220 MG tablet Take 220 mg by mouth at bedtime as needed (pain).    [provider]  NON DONNELLY Edin given by [employer] finger stick reader    [provider]    Family History Family History  Problem Relation Age of Onset   Hypertension Mother    Cancer Mother        breast cancer   Breast cancer Mother 79   Cancer Father        lung cancer   Multiple sclerosis Brother    Heart disease Brother        heart transplant   Breast cancer Maternal Aunt     Social History Social History   Tobacco Use   Smoking status: Never   Smokeless tobacco: Never  Vaping Use   Vaping status: Never Used  Substance Use Topics   Alcohol use: No   Drug use: No     Allergies   Hyoscyamine, Amoxicillin, Cephalexin, Hyoscyamine sulfate, Penicillins, and Sulfa antibiotics   Review of Systems Review of Systems  Constitutional:  Negative for diaphoresis.  HENT: Negative.    Eyes:  Positive for redness. Negative for photophobia, pain, discharge, itching and visual disturbance.  Respiratory:  Negative for shortness of breath.   Cardiovascular:  Negative for chest pain, palpitations and leg swelling.  Gastrointestinal:  Negative for blood in stool, nausea and vomiting.  Genitourinary:  Negative for dysuria and hematuria.  Neurological:  Positive for dizziness, light-headedness and numbness. Negative for speech difficulty, weakness and headaches.  All other systems reviewed and are negative.    Physical Exam Triage Vital Signs ED Triage Vitals  Encounter Vitals Group     BP      Girls Systolic BP Percentile      Girls Diastolic BP Percentile      Boys Systolic BP Percentile      Boys Diastolic BP Percentile      Pulse      Resp      Temp      Temp src      SpO2      Weight       Height      Head Circumference      Peak Flow      Pain Score      Pain Loc      Pain Education      Exclude from Growth Chart    No data found.  Updated Vital Signs BP 126/81 (BP Location: Left Arm)   Pulse 83   Temp 98.1 F (36.7 C) (Oral)   Resp 18   SpO2 97%   Visual Acuity Right Eye Distance:   Left Eye Distance:   Bilateral Distance:    Right Eye Near:   Left Eye Near:    Bilateral Near:     Physical Exam Vitals reviewed.  Constitutional:      General: She is awake. She is not in acute distress.    Appearance: Normal appearance. She is well-developed. She is not ill-appearing, toxic-appearing or diaphoretic.  HENT:     Head: Normocephalic.     Right Ear: Hearing normal.     Left Ear: Hearing normal.     Nose: Nose normal.     Mouth/Throat:     Mouth: Mucous membranes are moist.  Eyes:     General: Lids are normal. Lids are everted, no foreign bodies appreciated. Vision grossly intact. Gaze aligned appropriately. No visual field deficit.    Extraocular Movements: Extraocular movements intact.     Right eye: Normal extraocular motion and no nystagmus.     Left eye: Normal extraocular motion and no nystagmus.     Conjunctiva/sclera:     Left eye: Hemorrhage present.   Neck:     Trachea: Trachea normal.     Meningeal: Brudzinski's sign and Kernig's sign absent.  Cardiovascular:     Rate and Rhythm: Normal rate and regular rhythm.     Heart sounds: Murmur heard.  Pulmonary:     Effort: Pulmonary effort is normal.     Breath sounds: Normal breath sounds and air entry.  Abdominal:     Palpations: Abdomen is soft.  Musculoskeletal:        General: Normal range of motion.     Cervical back: Full passive range of motion without pain, normal  range of motion and neck supple. No rigidity or tenderness.     Right lower leg: No edema.     Left lower leg: No edema.  Skin:    General: Skin is warm and dry.  Neurological:     General: No focal deficit  present.     Mental Status: She is alert and oriented to person, place, and time.     Cranial Nerves: No cranial nerve deficit.     Sensory: Sensation is intact. No sensory deficit.     Motor: Motor function is intact. No weakness.     Coordination: Coordination is intact.     Gait: Gait is intact.  Psychiatric:        Speech: Speech normal.        Behavior: Behavior is cooperative.      UC Treatments / Results  Labs (all labs ordered are listed, but only abnormal results are displayed) Labs Reviewed - No data to display  EKG   Radiology No results found.  Procedures Procedures (including critical care time)  Medications Ordered in UC Medications - No data to display  Initial Impression / Assessment and Plan / UC Course  I have reviewed the triage vital signs and the nursing notes.  Pertinent labs & imaging results that were available during my care of the patient were reviewed by me and considered in my medical decision making (see chart for details).     The patient presents with a painless red area in the right eye, noted earlier today. She denies itching, vision changes, or trauma. Examination confirms a subconjunctival hemorrhage, likely triggered by a forceful sneeze this morning. This is a benign condition that does not affect vision. The patient was reassured that no treatment is necessary and the hemorrhage should resolve spontaneously within two weeks.  The patient also reports dizziness and lightheadedness upon waking in the early morning hours to use the restroom. Symptoms are positional, occurring when lying down or standing up, and resolve quickly. She denies associated nausea, vomiting, headache, visual changes, weakness, or sensory deficits. The pattern is consistent with positional vertigo. Meclizine was prescribed to take at bedtime for 5 days. She was advised to move slowly from lying to standing to prevent falls and to stay well hydrated. Emergency  precautions were reviewed, including immediate evaluation for any severe headache, visual disturbances, persistent nausea, or new neurologic symptoms. The patient is scheduled to follow up with her primary care provider on Monday, August 28, 2023.  Today's evaluation has revealed no signs of a dangerous process. Discussed diagnosis with patient and/or guardian. Patient and/or guardian aware of their diagnosis, possible red flag symptoms to watch out for and need for close follow up. Patient and/or guardian understands verbal and written discharge instructions. Patient and/or guardian comfortable with plan and disposition.  Patient and/or guardian has a clear mental status at this time, good insight into illness (after discussion and teaching) and has clear judgment to make decisions regarding their care  Documentation was completed with the aid of voice recognition software. Transcription may contain typographical errors. Final diagnoses:  Subconjunctival hematoma, left  Nocturnal vertigo     Discharge Instructions      You were evaluated today for a red area in your right eye. This was diagnosed as a subconjunctival hemorrhage, which is a small broken blood vessel in the white part of the eye. It is harmless, does not affect vision, and often occurs after sneezing, coughing, or straining. No treatment is  needed. The redness may look worse before it gets better but should clear on its own within two weeks. Avoid rubbing your eye, and use artificial tears if it feels irritated.  You were also seen for episodes of dizziness and lightheadedness, especially when getting up in the early morning. These symptoms are consistent with nocturnal vertigo, which is often triggered by changes in head or body position. You were prescribed meclizine to take once daily at bedtime for 5 days to help relieve dizziness. Move slowly when changing positions, especially when getting out of bed or standing up. Drink plenty of  fluids during the day to help maintain balance and circulation.  Follow up with your primary care provider as scheduled on Monday, August 28, 2023.   Go to the emergency room if you experience severe headache, changes in vision, vomiting, weakness, difficulty speaking, numbness, or new confusion, as these may be signs of a more serious condition.      ED Prescriptions     Medication Sig Dispense Auth. Provider   meclizine (ANTIVERT) 25 MG tablet Take 1 tablet (25 mg total) by mouth at bedtime. 5 tablet Iola Lukes, FNP      PDMP not reviewed this encounter.   Iola Lukes, [place] 08/23/23 1940

## 2023-08-23 NOTE — ED Triage Notes (Signed)
 Pt reports intermittent dizziness episodes x5-6 days. Episodes are only in the morning when she first wakes up. Room is not spinning but moving during those episodes and they subside once she gets up and going. Denies nausea during episodes. Pt notes this does not happen consecutively every morning but start on last Friday. Blood pressure was high that first day due to anxiety, but checked it with each other episode and bp was normal.   Pt also notes redness to L inner eye like a popped blood vessel. Noticed this a few hrs ago. Reports this same thing happened to her eye in Feb '25 and resolved on its own. No dizziness then.

## 2023-08-23 NOTE — Discharge Instructions (Addendum)
 You were evaluated today for a red area in your right eye. This was diagnosed as a subconjunctival hemorrhage, which is a small broken blood vessel in the white part of the eye. It is harmless, does not affect vision, and often occurs after sneezing, coughing, or straining. No treatment is needed. The redness may look worse before it gets better but should clear on its own within two weeks. Avoid rubbing your eye, and use artificial tears if it feels irritated.  You were also seen for episodes of dizziness and lightheadedness, especially when getting up in the early morning. These symptoms are consistent with nocturnal vertigo, which is often triggered by changes in head or body position. You were prescribed meclizine to take once daily at bedtime for 5 days to help relieve dizziness. Move slowly when changing positions, especially when getting out of bed or standing up. Drink plenty of fluids during the day to help maintain balance and circulation.  Follow up with your primary care provider as scheduled on Monday, August 28, 2023.   Go to the emergency room if you experience severe headache, changes in vision, vomiting, weakness, difficulty speaking, numbness, or new confusion, as these may be signs of a more serious condition.

## 2023-10-30 ENCOUNTER — Other Ambulatory Visit: Payer: Self-pay | Admitting: Adult Medicine

## 2023-10-30 DIAGNOSIS — Z1231 Encounter for screening mammogram for malignant neoplasm of breast: Secondary | ICD-10-CM

## 2023-11-06 ENCOUNTER — Ambulatory Visit
Admission: RE | Admit: 2023-11-06 | Discharge: 2023-11-06 | Disposition: A | Discharge status: Home / Self Care | Source: Ambulatory Visit | Attending: Adult Medicine | Admitting: Adult Medicine

## 2023-11-06 DIAGNOSIS — Z1231 Encounter for screening mammogram for malignant neoplasm of breast: Secondary | ICD-10-CM
